# Patient Record
Sex: Male | Born: 1950 | Race: White | Hispanic: No | Marital: Married | Smoking: Never smoker
Health system: Southern US, Community
[De-identification: ages and names within clinical notes are randomized; demographics above are authoritative.]

## PROBLEM LIST (undated history)

## (undated) DIAGNOSIS — Z9989 Dependence on other enabling machines and devices: Secondary | ICD-10-CM

## (undated) DIAGNOSIS — I493 Ventricular premature depolarization: Secondary | ICD-10-CM

## (undated) DIAGNOSIS — G4733 Obstructive sleep apnea (adult) (pediatric): Secondary | ICD-10-CM

## (undated) DIAGNOSIS — R7303 Prediabetes: Secondary | ICD-10-CM

## (undated) DIAGNOSIS — Z973 Presence of spectacles and contact lenses: Secondary | ICD-10-CM

## (undated) DIAGNOSIS — I10 Essential (primary) hypertension: Secondary | ICD-10-CM

## (undated) DIAGNOSIS — N201 Calculus of ureter: Secondary | ICD-10-CM

## (undated) DIAGNOSIS — Z87442 Personal history of urinary calculi: Secondary | ICD-10-CM

## (undated) DIAGNOSIS — N200 Calculus of kidney: Secondary | ICD-10-CM

## (undated) HISTORY — DX: Ventricular premature depolarization: I49.3

## (undated) HISTORY — DX: Essential (primary) hypertension: I10

---

## 2004-08-27 ENCOUNTER — Emergency Department (HOSPITAL_COMMUNITY): Admission: EM | Admit: 2004-08-27 | Discharge: 2004-08-27 | Payer: Self-pay

## 2004-10-11 ENCOUNTER — Ambulatory Visit: Payer: Self-pay | Admitting: Orthopedic Surgery

## 2014-08-26 ENCOUNTER — Ambulatory Visit (INDEPENDENT_AMBULATORY_CARE_PROVIDER_SITE_OTHER): Payer: BC Managed Care – PPO | Admitting: Cardiology

## 2014-08-26 ENCOUNTER — Encounter: Payer: Self-pay | Admitting: Cardiology

## 2014-08-26 VITALS — BP 158/83 | HR 73 | Ht 68.0 in | Wt 201.0 lb

## 2014-08-26 DIAGNOSIS — I493 Ventricular premature depolarization: Secondary | ICD-10-CM

## 2014-08-26 DIAGNOSIS — Z136 Encounter for screening for cardiovascular disorders: Secondary | ICD-10-CM

## 2014-08-26 DIAGNOSIS — R002 Palpitations: Secondary | ICD-10-CM

## 2014-08-26 DIAGNOSIS — I4949 Other premature depolarization: Secondary | ICD-10-CM

## 2014-08-26 NOTE — Progress Notes (Signed)
       Clinical Summary Mr. Faz is a 63 y.o.male seen today as a new patient for the following medical problems.   1. 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 Denies any DOE.  - denies any LE edema, no orthopnea - rare coffee, 3 Dr peppers a day, no energy drinks, occas beer    PMH 1. Chronic pain 2. HTN 3. OSA on CPAP 4. Prediabetes   Past Medical History  Diagnosis Date  . Hypertension   . Diabetes mellitus without complication     diet controlled     No Known Allergies   Current Outpatient Prescriptions  Medication Sig Dispense Refill  . lisinopril (PRINIVIL,ZESTRIL) 20 MG tablet Take 20 mg by mouth 2 (two) times daily.      . naproxen sodium (ALEVE) 220 MG tablet Take 220 mg by mouth as needed.       No current facility-administered medications for this visit.     History reviewed. No pertinent past surgical history.   No Known Allergies    Family History  Problem Relation Age of Onset  . Cancer Mother   . Cancer Brother      Social History Mr. Kaneko reports that he has never smoked. He has never used smokeless tobacco. Mr. Garrido has no alcohol history on file.   Review of Systems CONSTITUTIONAL: No weight loss, fever, chills, weakness or fatigue.  HEENT: Eyes: No visual loss, blurred vision, double vision or yellow sclerae.No hearing loss, sneezing, congestion, runny nose or sore throat.  SKIN: No rash or itching.  CARDIOVASCULAR: per HPI RESPIRATORY: No shortness of breath, cough or sputum.  GASTROINTESTINAL: No anorexia, nausea, vomiting or diarrhea. No abdominal pain or blood.  GENITOURINARY: No burning on urination, no polyuria NEUROLOGICAL: No headache, dizziness, syncope, paralysis, ataxia, numbness or tingling in the extremities. No change in bowel or bladder control.  MUSCULOSKELETAL: No muscle, back pain, joint pain or stiffness.  LYMPHATICS: No enlarged nodes. No history  of splenectomy.  PSYCHIATRIC: No history of depression or anxiety.  ENDOCRINOLOGIC: No reports of sweating, cold or heat intolerance. No polyuria or polydipsia.  Marland Kitchen   Physical Examination Filed Vitals:   08/26/14 1325  BP: 158/83  Pulse: 73   Filed Weights   08/26/14 1325  Weight: 201 lb (91.173 kg)    Gen: resting comfortably, no acute distress HEENT: no scleral icterus, pupils equal round and reactive, no palptable cervical adenopathy,  CV Resp: Clear to auscultation bilaterally GI: abdomen is soft, non-tender, non-distended, normal bowel sounds, no hepatosplenomegaly MSK: extremities are warm, no edema.  Skin: warm, no rash Neuro:  no focal deficits Psych: appropriate affect    Assessment and Plan  1. Premature ventricular contractions - noted on 12 lead EKG from primary physician - denies any significant symtpoms - likely benign finding, will have patient obtain 24 hr holter monitor to quantify PVC burden and evaluate for any sustained arrhythmias, especially given his DOT requirements.      Arnoldo Lenis, M.D.

## 2014-08-26 NOTE — Patient Instructions (Signed)
Your physician has recommended that you wear a 24 hour holter monitor. Holter monitors are medical devices that record the heart's electrical activity. Doctors most often use these monitors to diagnose arrhythmias. Arrhythmias are problems with the speed or rhythm of the heartbeat. The monitor is a small, portable device. You can wear one while you do your normal daily activities. This is usually used to diagnose what is causing palpitations/syncope (passing out). Office will contact with results via phone or letter.   Continue all current medications. Follow up pending test results

## 2014-08-27 DIAGNOSIS — I493 Ventricular premature depolarization: Secondary | ICD-10-CM | POA: Insufficient documentation

## 2014-09-01 ENCOUNTER — Telehealth: Payer: Self-pay | Admitting: Cardiology

## 2014-09-01 DIAGNOSIS — I499 Cardiac arrhythmia, unspecified: Secondary | ICD-10-CM

## 2014-09-01 NOTE — Telephone Encounter (Signed)
Would like to know results from hear monitor

## 2014-09-03 NOTE — Telephone Encounter (Signed)
24 hr holter monitor uploaded on 08/30/2014.  Results retrieved from [employer] site (e-cardio) on 09/02/2014.  Dr. Bronson Ing reviewed monitor as Dr. Harl Bowie not back in [place] office till 09/13/2014.    Per Dr. Bronson Ing - 1)  sinus rhythm with PAC's & frequent PVC's.                                       2)  2 runs of non-sustained ventricular tachycardia, 5-beat and 6-beat respectively.     Patient informed of above.  Patient is questioning if he is okay to go back to work.  No follow up was scheduled as it was to be based on test results.

## 2014-09-06 NOTE — Telephone Encounter (Signed)
Patient notified.  He agrees to move forward with further testing.  Will put order in for Echo & forward to [facility]) for scheduling.

## 2014-09-06 NOTE — Telephone Encounter (Signed)
Please let patient know that we did see frequent extra heart beats, including short episodes of an abnormal rhythm. We need to work this up further with an echocardiogram, and potentially a stress test. Would start with echo for ventricular arrhythmia, please schedule ASAP as I know he is wanting to get back to work, can offer [place] echo if he can do it and its available earlier. 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 Please let him know we will work to resolve this as soon as we can but have to keep in mind his safety and that of others.

## 2014-09-09 ENCOUNTER — Ambulatory Visit (HOSPITAL_COMMUNITY)
Admission: RE | Admit: 2014-09-09 | Discharge: 2014-09-09 | Disposition: A | Discharge status: Home / Self Care | Payer: BC Managed Care – PPO | Source: Ambulatory Visit | Attending: Cardiology | Admitting: Cardiology

## 2014-09-09 DIAGNOSIS — I059 Rheumatic mitral valve disease, unspecified: Secondary | ICD-10-CM

## 2014-09-09 DIAGNOSIS — I499 Cardiac arrhythmia, unspecified: Secondary | ICD-10-CM

## 2014-09-09 DIAGNOSIS — I493 Ventricular premature depolarization: Secondary | ICD-10-CM | POA: Insufficient documentation

## 2014-09-09 HISTORY — PX: TRANSTHORACIC ECHOCARDIOGRAM: SHX275

## 2014-09-09 NOTE — Progress Notes (Signed)
Echocardiogram 2D Echocardiogram has been performed.  Trevor Ayala M 09/09/2014, 2:44 PM

## 2014-09-13 ENCOUNTER — Telehealth: Payer: Self-pay | Admitting: *Deleted

## 2014-09-13 DIAGNOSIS — I499 Cardiac arrhythmia, unspecified: Secondary | ICD-10-CM

## 2014-09-13 NOTE — Telephone Encounter (Signed)
Pt informed Will schedule test with [facility]

## 2014-09-16 ENCOUNTER — Telehealth: Payer: Self-pay | Admitting: *Deleted

## 2014-09-16 ENCOUNTER — Encounter: Payer: Self-pay | Admitting: Cardiology

## 2014-09-16 NOTE — Telephone Encounter (Signed)
Patient informed that there are no med holds for upcoming stress test per Dr. Harl Bowie

## 2014-09-16 NOTE — Telephone Encounter (Signed)
Message copied by Orion Modest on Thu Sep 16, 2014 10:17 AM ------      Message from: Laurine Blazer      Created: Thu Sep 16, 2014  9:18 AM      Regarding: FW: INSTRUCTIONS FOR STRESS TEST.                   ----- Message -----         From: Chanda Busing         Sent: 09/16/2014   8:48 AM           To: Laurine Blazer, LPN      Subject: INSTRUCTIONS FOR STRESS TEST.                            I called Mr. Birden today in regards to date and time of      His stress test. He was not given any instructions. Need to verify about his BP medications.  Can you advise if patient can be      Called back.  ------

## 2014-09-20 ENCOUNTER — Ambulatory Visit: Payer: Self-pay | Admitting: Cardiovascular Disease

## 2014-09-21 ENCOUNTER — Encounter (HOSPITAL_COMMUNITY): Payer: BC Managed Care – PPO

## 2014-09-21 ENCOUNTER — Other Ambulatory Visit (HOSPITAL_COMMUNITY): Payer: BC Managed Care – PPO

## 2014-09-23 ENCOUNTER — Other Ambulatory Visit (HOSPITAL_COMMUNITY): Payer: Self-pay | Admitting: Internal Medicine

## 2014-09-23 ENCOUNTER — Ambulatory Visit (HOSPITAL_COMMUNITY)
Admission: RE | Admit: 2014-09-23 | Discharge: 2014-09-23 | Disposition: A | Discharge status: Home / Self Care | Payer: BC Managed Care – PPO | Source: Ambulatory Visit | Attending: Internal Medicine | Admitting: Internal Medicine

## 2014-09-23 DIAGNOSIS — N200 Calculus of kidney: Secondary | ICD-10-CM

## 2014-09-23 DIAGNOSIS — N132 Hydronephrosis with renal and ureteral calculous obstruction: Secondary | ICD-10-CM | POA: Insufficient documentation

## 2014-09-23 DIAGNOSIS — R1031 Right lower quadrant pain: Secondary | ICD-10-CM | POA: Diagnosis present

## 2014-09-24 ENCOUNTER — Telehealth: Payer: Self-pay | Admitting: Cardiology

## 2014-09-24 NOTE — Telephone Encounter (Signed)
Called pt to relay information. Left message for him to return my call.

## 2014-09-24 NOTE — Telephone Encounter (Signed)
The stress test is an important test to see if the extra heart beats he is frequently having is related to any blockages in the arteries of his heart. If the test is normal, then no further plans for testing. If abnormal stress then he would require further testing, possibly a heart cath.    Zandra Abts MD

## 2014-09-24 NOTE — Telephone Encounter (Signed)
Pt informed of info below. Pt also let us know that he had a CT scan done yesterday and informed that he has numerous kidney stones.  FYI: CT Report is in his chart.

## 2014-09-24 NOTE — Telephone Encounter (Signed)
Patient is requesting a call back today.

## 2014-09-24 NOTE — Telephone Encounter (Signed)
Pt informed that his stress test would help determine whether or not he will be able to return to work. Pt wanted a definite answer that after this test Dr. Harl Bowie would allow him to return to work. Pt also informed that his results will help determine what will be done next, if anything. Pt does not like the idea of him having a stress test, everything looking okay and it be a waste of time. Pt previously cancelled stress test because his wife had a stroke. Pt will think about rescheduling his stress test and give the office a call back.

## 2014-09-28 ENCOUNTER — Other Ambulatory Visit: Payer: Self-pay | Admitting: Urology

## 2014-09-30 ENCOUNTER — Encounter (HOSPITAL_BASED_OUTPATIENT_CLINIC_OR_DEPARTMENT_OTHER): Payer: Self-pay | Admitting: *Deleted

## 2014-10-01 ENCOUNTER — Encounter (HOSPITAL_BASED_OUTPATIENT_CLINIC_OR_DEPARTMENT_OTHER): Payer: Self-pay | Admitting: *Deleted

## 2014-10-01 ENCOUNTER — Other Ambulatory Visit: Payer: Self-pay | Admitting: Urology

## 2014-10-01 NOTE — Progress Notes (Signed)
NPO AFTER MN. ARRIVE AT [id number]. NEEDS ISTAT 8.  CURRENT EKG IN CHART AND EPIC. MAY TAKE PERCOCET IF NEEDED DOS W/ SIPS OF WATER.

## 2014-10-06 ENCOUNTER — Encounter (HOSPITAL_BASED_OUTPATIENT_CLINIC_OR_DEPARTMENT_OTHER): Payer: BC Managed Care – PPO | Admitting: Anesthesiology

## 2014-10-06 ENCOUNTER — Encounter (HOSPITAL_BASED_OUTPATIENT_CLINIC_OR_DEPARTMENT_OTHER): Payer: Self-pay | Admitting: *Deleted

## 2014-10-06 ENCOUNTER — Encounter (HOSPITAL_BASED_OUTPATIENT_CLINIC_OR_DEPARTMENT_OTHER)
Admission: RE | Disposition: A | Discharge status: Home / Self Care | Payer: Self-pay | Source: Ambulatory Visit | Attending: Urology

## 2014-10-06 ENCOUNTER — Ambulatory Visit (HOSPITAL_BASED_OUTPATIENT_CLINIC_OR_DEPARTMENT_OTHER)
Admission: RE | Admit: 2014-10-06 | Discharge: 2014-10-06 | Disposition: A | Discharge status: Home / Self Care | Payer: BC Managed Care – PPO | Source: Ambulatory Visit | Attending: Urology | Admitting: Urology

## 2014-10-06 ENCOUNTER — Ambulatory Visit (HOSPITAL_BASED_OUTPATIENT_CLINIC_OR_DEPARTMENT_OTHER): Payer: BC Managed Care – PPO | Admitting: Anesthesiology

## 2014-10-06 DIAGNOSIS — G4733 Obstructive sleep apnea (adult) (pediatric): Secondary | ICD-10-CM | POA: Insufficient documentation

## 2014-10-06 DIAGNOSIS — I493 Ventricular premature depolarization: Secondary | ICD-10-CM | POA: Insufficient documentation

## 2014-10-06 DIAGNOSIS — I10 Essential (primary) hypertension: Secondary | ICD-10-CM | POA: Diagnosis not present

## 2014-10-06 DIAGNOSIS — N202 Calculus of kidney with calculus of ureter: Secondary | ICD-10-CM | POA: Diagnosis present

## 2014-10-06 DIAGNOSIS — N1339 Other hydronephrosis: Secondary | ICD-10-CM | POA: Insufficient documentation

## 2014-10-06 HISTORY — DX: Ventricular premature depolarization: I49.3

## 2014-10-06 HISTORY — DX: Calculus of kidney: N20.0

## 2014-10-06 HISTORY — DX: Personal history of urinary calculi: Z87.442

## 2014-10-06 HISTORY — DX: Calculus of ureter: N20.1

## 2014-10-06 HISTORY — DX: Obstructive sleep apnea (adult) (pediatric): G47.33

## 2014-10-06 HISTORY — DX: Obstructive sleep apnea (adult) (pediatric): Z99.89

## 2014-10-06 HISTORY — PX: CYSTOSCOPY WITH RETROGRADE PYELOGRAM, URETEROSCOPY AND STENT PLACEMENT: SHX5789

## 2014-10-06 HISTORY — PX: HOLMIUM LASER APPLICATION: SHX5852

## 2014-10-06 HISTORY — DX: Presence of spectacles and contact lenses: Z97.3

## 2014-10-06 LAB — POCT I-STAT, CHEM 8
BUN: 32 mg/dL — ABNORMAL HIGH (ref 6–23)
CALCIUM ION: 1.26 mmol/L (ref 1.13–1.30)
Chloride: 106 mEq/L (ref 96–112)
Creatinine, Ser: 1.7 mg/dL — ABNORMAL HIGH (ref 0.50–1.35)
GLUCOSE: 227 mg/dL — AB (ref 70–99)
HEMATOCRIT: 44 % (ref 39.0–52.0)
Hemoglobin: 15 g/dL (ref 13.0–17.0)
Potassium: 4.6 mEq/L (ref 3.7–5.3)
Sodium: 138 mEq/L (ref 137–147)
TCO2: 22 mmol/L (ref 0–100)

## 2014-10-06 SURGERY — CYSTOURETEROSCOPY, WITH RETROGRADE PYELOGRAM AND STENT INSERTION
Anesthesia: General | Site: Ureter | Laterality: Right

## 2014-10-06 MED ORDER — PROMETHAZINE HCL 25 MG/ML IJ SOLN
6.2500 mg | INTRAMUSCULAR | Status: DC | PRN
  Filled 2014-10-06: qty 1

## 2014-10-06 MED ORDER — FENTANYL CITRATE 0.05 MG/ML IJ SOLN
INTRAMUSCULAR | Status: DC | PRN
  Administered 2014-10-06 (×3): 25 ug via INTRAVENOUS
  Administered 2014-10-06: 50 ug via INTRAVENOUS
  Administered 2014-10-06: 25 ug via INTRAVENOUS

## 2014-10-06 MED ORDER — LIDOCAINE HCL (CARDIAC) 20 MG/ML IV SOLN
INTRAVENOUS | Status: DC | PRN
  Administered 2014-10-06: 60 mg via INTRAVENOUS

## 2014-10-06 MED ORDER — MIDAZOLAM HCL 5 MG/5ML IJ SOLN
INTRAMUSCULAR | Status: DC | PRN
  Administered 2014-10-06: 2 mg via INTRAVENOUS

## 2014-10-06 MED ORDER — DEXAMETHASONE SODIUM PHOSPHATE 4 MG/ML IJ SOLN
INTRAMUSCULAR | Status: DC | PRN
  Administered 2014-10-06: 8 mg via INTRAVENOUS

## 2014-10-06 MED ORDER — HYDROMORPHONE HCL 1 MG/ML IJ SOLN
0.2500 mg | INTRAMUSCULAR | Status: DC | PRN
  Filled 2014-10-06: qty 1

## 2014-10-06 MED ORDER — SENNOSIDES-DOCUSATE SODIUM 8.6-50 MG PO TABS: 1.0000 | ORAL_TABLET | Freq: Two times a day (BID) | ORAL | Status: DC

## 2014-10-06 MED ORDER — IOHEXOL 350 MG/ML SOLN
INTRAVENOUS | Status: DC | PRN
  Administered 2014-10-06: 20 mL

## 2014-10-06 MED ORDER — STERILE WATER FOR IRRIGATION IR SOLN
Status: DC | PRN
  Administered 2014-10-06: 500 mL

## 2014-10-06 MED ORDER — GENTAMICIN IN SALINE 1.6-0.9 MG/ML-% IV SOLN
80.0000 mg | INTRAVENOUS | Status: AC
  Administered 2014-10-06: 380 mg via INTRAVENOUS
  Filled 2014-10-06: qty 50

## 2014-10-06 MED ORDER — MEPERIDINE HCL 25 MG/ML IJ SOLN
6.2500 mg | INTRAMUSCULAR | Status: DC | PRN
  Filled 2014-10-06: qty 1

## 2014-10-06 MED ORDER — FENTANYL CITRATE 0.05 MG/ML IJ SOLN
INTRAMUSCULAR | Status: AC
  Filled 2014-10-06: qty 4

## 2014-10-06 MED ORDER — PROPOFOL 10 MG/ML IV BOLUS
INTRAVENOUS | Status: DC | PRN
  Administered 2014-10-06: 180 mg via INTRAVENOUS

## 2014-10-06 MED ORDER — SODIUM CHLORIDE 0.9 % IR SOLN
Status: DC | PRN
  Administered 2014-10-06: 7000 mL

## 2014-10-06 MED ORDER — ONDANSETRON HCL 4 MG/2ML IJ SOLN
INTRAMUSCULAR | Status: DC | PRN
  Administered 2014-10-06: 4 mg via INTRAVENOUS

## 2014-10-06 MED ORDER — CEPHALEXIN 500 MG PO CAPS: 500.0000 mg | ORAL_CAPSULE | Freq: Two times a day (BID) | ORAL | Status: DC

## 2014-10-06 MED ORDER — MIDAZOLAM HCL 2 MG/2ML IJ SOLN
INTRAMUSCULAR | Status: AC
  Filled 2014-10-06: qty 2

## 2014-10-06 MED ORDER — LACTATED RINGERS IV SOLN
INTRAVENOUS | Status: DC
  Administered 2014-10-06 (×2): via INTRAVENOUS
  Filled 2014-10-06: qty 1000

## 2014-10-06 MED ORDER — OXYCODONE HCL 5 MG PO TABS
5.0000 mg | ORAL_TABLET | Freq: Once | ORAL | Status: DC | PRN
  Filled 2014-10-06: qty 1

## 2014-10-06 MED ORDER — OXYCODONE HCL 5 MG/5ML PO SOLN
5.0000 mg | Freq: Once | ORAL | Status: DC | PRN
  Filled 2014-10-06: qty 5

## 2014-10-06 MED ORDER — OXYCODONE-ACETAMINOPHEN 5-325 MG PO TABS: 1.0000 | ORAL_TABLET | Freq: Four times a day (QID) | ORAL | Status: DC | PRN

## 2014-10-06 SURGICAL SUPPLY — 44 items
BAG DRAIN URO-CYSTO SKYTR STRL (DRAIN) ×2 IMPLANT
BAG DRN UROCATH (DRAIN) ×1
BASKET LASER NITINOL 1.9FR (BASKET) ×2 IMPLANT
BASKET STNLS GEMINI 4WIRE 3FR (BASKET) IMPLANT
BASKET ZERO TIP NITINOL 2.4FR (BASKET) IMPLANT
BSKT STON RTRVL 120 1.9FR (BASKET) ×1
BSKT STON RTRVL GEM 120X11 3FR (BASKET)
BSKT STON RTRVL ZERO TP 2.4FR (BASKET)
CANISTER SUCT LVC 12 LTR MEDI- (MISCELLANEOUS) ×2 IMPLANT
CATH INTERMIT  6FR 70CM (CATHETERS) ×2 IMPLANT
CATH URET 5FR 28IN CONE TIP (BALLOONS)
CATH URET 5FR 28IN OPEN ENDED (CATHETERS) ×1 IMPLANT
CATH URET 5FR 70CM CONE TIP (BALLOONS) IMPLANT
CLOTH BEACON ORANGE TIMEOUT ST (SAFETY) ×2 IMPLANT
DRAPE CAMERA CLOSED 9X96 (DRAPES) ×2 IMPLANT
ELECT REM PT RETURN 9FT ADLT (ELECTROSURGICAL)
ELECTRODE REM PT RTRN 9FT ADLT (ELECTROSURGICAL) IMPLANT
FIBER LASER FLEXIVA 200 (UROLOGICAL SUPPLIES) IMPLANT
FIBER LASER FLEXIVA 365 (UROLOGICAL SUPPLIES) IMPLANT
FIBER LASER TRAC TIP (UROLOGICAL SUPPLIES) ×1 IMPLANT
GLOVE BIO SURGEON STRL SZ7.5 (GLOVE) ×3 IMPLANT
GLOVE SURG SS PI 7.5 STRL IVOR (GLOVE) ×2 IMPLANT
GOWN PREVENTION PLUS LG XLONG (DISPOSABLE) ×2 IMPLANT
GOWN STRL REIN XL XLG (GOWN DISPOSABLE) ×2 IMPLANT
GOWN STRL REUS W/ TWL XL LVL3 (GOWN DISPOSABLE) IMPLANT
GOWN STRL REUS W/TWL XL LVL3 (GOWN DISPOSABLE) ×4
GUIDEWIRE 0.038 PTFE COATED (WIRE) IMPLANT
GUIDEWIRE ANG ZIPWIRE 038X150 (WIRE) ×2 IMPLANT
GUIDEWIRE STR DUAL SENSOR (WIRE) ×2 IMPLANT
IV NS 1000ML (IV SOLUTION) ×2
IV NS 1000ML BAXH (IV SOLUTION) IMPLANT
IV NS IRRIG 3000ML ARTHROMATIC (IV SOLUTION) ×4 IMPLANT
KIT BALLIN UROMAX 15FX10 (LABEL) IMPLANT
KIT BALLN UROMAX 15FX4 (MISCELLANEOUS) IMPLANT
KIT BALLN UROMAX 26 75X4 (MISCELLANEOUS)
PACK CYSTO (CUSTOM PROCEDURE TRAY) ×2 IMPLANT
SET HIGH PRES BAL DIL (LABEL)
SHEATH ACCESS URETERAL 38CM (SHEATH) ×1 IMPLANT
SHEATH URET ACCESS 12FR/35CM (UROLOGICAL SUPPLIES) IMPLANT
SHEATH URET ACCESS 12FR/55CM (UROLOGICAL SUPPLIES) IMPLANT
STENT URET 6FRX26 CONTOUR (STENTS) ×1 IMPLANT
SYRINGE 10CC LL (SYRINGE) ×2 IMPLANT
SYRINGE IRR TOOMEY STRL 70CC (SYRINGE) IMPLANT
TUBE FEEDING 8FR 16IN STR KANG (MISCELLANEOUS) ×1 IMPLANT

## 2014-10-06 NOTE — Anesthesia Procedure Notes (Signed)
Procedure Name: LMA Insertion Date/Time: 10/06/2014 9:53 AM Performed by: Denna Haggard D Pre-anesthesia Checklist: Patient identified, Emergency Drugs available, Suction available and Patient being monitored Patient Re-evaluated:Patient Re-evaluated prior to inductionOxygen Delivery Method: Circle System Utilized Preoxygenation: Pre-oxygenation with 100% oxygen Intubation Type: IV induction Ventilation: Mask ventilation without difficulty LMA: LMA inserted LMA Size: 4.0 Number of attempts: 1 Airway Equipment and Method: bite block Placement Confirmation: positive ETCO2 Tube secured with: Tape Dental Injury: Teeth and Oropharynx as per pre-operative assessment

## 2014-10-06 NOTE — Anesthesia Postprocedure Evaluation (Signed)
  Anesthesia Post-op Note  Patient: Sammer Scafidi Learn  Procedure(s) Performed: Procedure(s) (LRB): CYSTOSCOPY WITH RETROGRADE PYELOGRAM, URETEROSCOPY AND STENT PLACEMENT (Right) HOLMIUM LASER APPLICATION (Right)  Patient Location: PACU  Anesthesia Type: General  Level of Consciousness: awake and alert   Airway and Oxygen Therapy: Patient Spontanous Breathing  Post-op Pain: mild  Post-op Assessment: Post-op Vital signs reviewed, Patient's Cardiovascular Status Stable, Respiratory Function Stable, Patent Airway and No signs of Nausea or vomiting  Last Vitals:  Filed Vitals:   10/06/14 1340  BP: 105/77  Pulse: 78  Temp: 37.1 C  Resp: 18    Post-op Vital Signs: stable   Complications: No apparent anesthesia complications

## 2014-10-06 NOTE — Brief Op Note (Signed)
10/06/2014  12:02 PM  PATIENT:  Trevor Ayala  63 y.o. male  PRE-OPERATIVE DIAGNOSIS:  RIGHT URETERAL STONE  POST-OPERATIVE DIAGNOSIS:  RIGHT URETERAL STONE  PROCEDURE:  Procedure(s): CYSTOSCOPY WITH RETROGRADE PYELOGRAM, URETEROSCOPY AND STENT PLACEMENT (Right) HOLMIUM LASER APPLICATION (Right)  SURGEON:  Surgeon(s) and Role:    * Alexis Frock, MD - Primary  PHYSICIAN ASSISTANT:   ASSISTANTS: Gypsy Lore MD   ANESTHESIA:   general  EBL:  Total I/O In: 1000 [I.V.:1000] Out: -   BLOOD ADMINISTERED:none  DRAINS: none   LOCAL MEDICATIONS USED:  NONE  SPECIMEN:  Source of Specimen:  Rt ureteral stone fragments  DISPOSITION OF SPECIMEN:  [facility] for compositional analysis  COUNTS:  YES  TOURNIQUET:  * No tourniquets in log *  DICTATION: .Other Dictation: Dictation Number [id number]  PLAN OF CARE: Discharge to home after PACU  PATIENT DISPOSITION:  PACU - hemodynamically stable.   Delay start of Pharmacological VTE agent (>24hrs) due to surgical blood loss or risk of bleeding: yes

## 2014-10-06 NOTE — H&P (Signed)
Trevor Ayala is an 63 y.o. male.    Chief Complaint: Pre-Op Right Ureteroscopic Stone Manipulation  HPI:    1 - Large Volume Bilateral Nephrolithiasis - Rt UPJ 28mm stone with mod hydro (SSD 14cm, 550HU) and Lt lower-mid partial stagorhn (4cm total volume, no hydro, 800HU) by CT 09/2014 on eval right flank pain. Several prior stones all passed medically.   2- Metabolic Stone Disease -  Eval 2015: BMP,PTH,Urate - pending; Composition - pending; 15 Hr Urines - pending  PMH sig for arrythmia, no ischemic heart disease. No blood thinners.  Today Trevor Ayala is seen to proceed with ureteroscopic stone manipulation for his large right proximal ureteral stone. He has elected initial observation of his left sided non-obstructing stone.     Past Medical History  Diagnosis Date  . Hypertension   . Prediabetes     diet controlled  . Asymptomatic PVCs   . OSA on CPAP   . Right ureteral stone   . Renal calculus, left   . History of kidney stones   . Wears glasses     Past Surgical History  Procedure Laterality Date  . Transthoracic echocardiogram  09-09-2014    mild LVH/  ef [id number]  grade I diastolic dysfunction/  mild MR/  mild sclerotic Av    Family History  Problem Relation Age of Onset  . Cancer Mother   . Cancer Brother    Social History:  reports that he has never smoked. He has never used smokeless tobacco. He reports that he drinks alcohol. He reports that he does not use illicit drugs.  Allergies: No Known Allergies  No prescriptions prior to admission    No results found for this or any previous visit (from the past 48 hour(s)). No results found.  Review of Systems  Constitutional: Negative for fever and chills.  HENT: Negative.   Eyes: Negative.   Respiratory: Negative.   Cardiovascular: Negative.   Gastrointestinal: Positive for nausea. Negative for vomiting.  Genitourinary: Positive for flank pain.  Musculoskeletal: Negative.   Skin: Negative.    Neurological: Negative.   Endo/Heme/Allergies: Negative.   Psychiatric/Behavioral: Negative.     Height 5\' 8"  (1.727 m), weight 90.719 kg (200 lb). Physical Exam  Constitutional: He appears well-developed.  HENT:  Head: Normocephalic.  Eyes: Pupils are equal, round, and reactive to light.  Neck: Normal range of motion.  Cardiovascular: Normal rate.   Respiratory: Effort normal.  GI: Soft.  Genitourinary:  Mild Rt  CVAT  Musculoskeletal: Normal range of motion.  Neurological: He is alert.  Skin: Skin is warm and dry.  Psychiatric: He has a normal mood and affect. His behavior is normal. Judgment and thought content normal.     Assessment/Plan  1 - Large Volume Bilateral Nephrolithiasis - he wants to proceed as discussed with right sided ureteroscopy.  We rediscussed ureteroscopic stone manipulation with basketing and laser-lithotripsy in detail.  We rediscussed risks including bleeding, infection, damage to kidney / ureter  bladder, rarely loss of kidney. We rediscussed anesthetic risks and rare but serious surgical complications including DVT, PE, MI, and mortality. We specifically readdressed that in 5-10% of cases a staged approach is required with stenting followed by re-attempt ureteroscopy if anatomy unfavorable.   The patient voiced understanding and wises to proceed.   2- Metabolic Stone Disease - large multifocal stones high risk and warrants eval. BMP,PTH,Urate pending, other studies to follow.   Kordel Leavy 10/06/2014, 6:32 AM

## 2014-10-06 NOTE — Discharge Instructions (Signed)
1 - You may have urinary urgency (bladder spasms) and bloody urine on / off with stent in place. This is normal. ° °2 - Call MD or go to ER for fever >102, severe pain / nausea / vomiting not relieved by medications, or acute change in medical status °[facility] °[phone] °Post Ureteroscopy With or Without Stent Instructions ° °Definitions: ° °Ureter: The duct that transports urine from the kidney to the bladder. °Stent:   A plastic hollow tube that is placed into the ureter, from the kidney to the                 bladder to prevent the ureter from swelling shut. ° °GENERAL INSTRUCTIONS: ° °Despite the fact that no skin incisions were used, the area around the ureter and bladder is raw and irritated. The stent is a foreign body which will further irritate the bladder wall. This irritation is manifested by increased frequency of urination, both day and night, and by an increase in the urge to urinate. In some, the urge to urinate is present almost always. Sometimes the urge is strong enough that you may not be able to stop yourself from urinating. The only real cure is to remove the stent and then give time for the bladder wall to heal which can't be done until the danger of the ureter swelling shut has passed, which varies. ° °You may see some blood in your urine while the stent is in place and a few days afterwards. Do not be alarmed, even if the urine was clear for a while. Get off your feet and drink lots of fluids until clearing occurs. If you start to pass clots or don't improve, call us. ° °DIET: °You may return to your normal diet immediately. Because of the raw surface of your bladder, alcohol, spicy foods, acid type foods and drinks with caffeine may cause irritation or frequency and should be used in moderation. To keep your urine flowing freely and to avoid constipation, drink plenty of fluids during the day ( 8-10 glasses ). °Tip: Avoid cranberry juice because it is very  acidic. ° °ACTIVITY: °Your physical activity doesn't need to be restricted. However, if you are very active, you may see some blood in your urine. We suggest that you reduce your activity under these circumstances until the bleeding has stopped. ° °BOWELS: °It is important to keep your bowels regular during the postoperative period. Straining with bowel movements can cause bleeding. A bowel movement every other day is reasonable. Use a mild laxative if needed, such as Milk of Magnesia 2-3 tablespoons, or 2 Dulcolax tablets. Call if you continue to have problems. If you have been taking narcotics for pain, before, during or after your surgery, you may be constipated. Take a laxative if necessary. ° ° °MEDICATION: °You should resume your pre-surgery medications unless told not to. In addition you will often be given an antibiotic to prevent infection. These should be taken as prescribed until the bottles are finished unless you are having an unusual reaction to one of the drugs. ° °PROBLEMS YOU SHOULD REPORT TO US: °· Fevers over 100.5 Fahrenheit. °· Heavy bleeding, or clots ( See above notes about blood in urine ). °· Inability to urinate. °· Drug reactions ( hives, rash, nausea, vomiting, diarrhea ). °· Severe burning or pain with urination that is not improving. ° °FOLLOW-UP: °You will need a follow-up appointment to monitor your progress. Call for this appointment at the number listed above.   Usually the first appointment will be about three to fourteen days after your surgery.      Post Anesthesia Home Care Instructions  Activity: Get plenty of rest for the remainder of the day. A responsible adult should stay with you for 24 hours following the procedure.  For the next 24 hours, DO NOT: -Drive a car -Paediatric nurse -Drink alcoholic beverages -Take any medication unless instructed by your physician -Make any legal decisions or sign important papers.  Meals: Start with liquid foods such as  gelatin or soup. Progress to regular foods as tolerated. Avoid greasy, spicy, heavy foods. If nausea and/or vomiting occur, drink only clear liquids until the nausea and/or vomiting subsides. Call your physician if vomiting continues.  Special Instructions/Symptoms: Your throat may feel dry or sore from the anesthesia or the breathing tube placed in your throat during surgery. If this causes discomfort, gargle with warm salt water. The discomfort should disappear within 24 hours.

## 2014-10-06 NOTE — Transfer of Care (Signed)
Immediate Anesthesia Transfer of Care Note  Patient: Trevor Ayala  Procedure(s) Performed: Procedure(s) (LRB): CYSTOSCOPY WITH RETROGRADE PYELOGRAM, URETEROSCOPY AND STENT PLACEMENT (Right) HOLMIUM LASER APPLICATION (Right)  Patient Location: PACU  Anesthesia Type: General  Level of Consciousness: awake, oriented, sedated and patient cooperative  Airway & Oxygen Therapy: Patient Spontanous Breathing and Patient connected to face mask oxygen  Post-op Assessment: Report given to PACU RN and Post -op Vital signs reviewed and stable  Post vital signs: Reviewed and stable  Complications: No apparent anesthesia complications

## 2014-10-06 NOTE — Anesthesia Preprocedure Evaluation (Signed)
Anesthesia Evaluation  Patient identified by MRN, date of birth, ID band Patient awake    Reviewed: Allergy & Precautions, H&P , NPO status , Patient's Chart, lab work & pertinent test results  Airway Mallampati: II  TM Distance: >3 FB Neck ROM: Full    Dental no notable dental hx.    Pulmonary sleep apnea and Continuous Positive Airway Pressure Ventilation ,  breath sounds clear to auscultation  Pulmonary exam normal       Cardiovascular hypertension, Pt. on medications negative cardio ROS  Rhythm:Regular Rate:Normal     Neuro/Psych negative neurological ROS  negative psych ROS   GI/Hepatic negative GI ROS, Neg liver ROS,   Endo/Other  negative endocrine ROS  Renal/GU Renal disease     Musculoskeletal negative musculoskeletal ROS (+)   Abdominal   Peds  Hematology negative hematology ROS (+)   Anesthesia Other Findings   Reproductive/Obstetrics                             Anesthesia Physical Anesthesia Plan  ASA: II  Anesthesia Plan: General   Post-op Pain Management:    Induction: Intravenous  Airway Management Planned: LMA  Additional Equipment:   Intra-op Plan:   Post-operative Plan: Extubation in OR  Informed Consent: I have reviewed the patients History and Physical, chart, labs and discussed the procedure including the risks, benefits and alternatives for the proposed anesthesia with the patient or authorized representative who has indicated his/her understanding and acceptance.   Dental advisory given  Plan Discussed with: CRNA  Anesthesia Plan Comments:         Anesthesia Quick Evaluation

## 2014-10-07 ENCOUNTER — Encounter (HOSPITAL_BASED_OUTPATIENT_CLINIC_OR_DEPARTMENT_OTHER): Payer: Self-pay | Admitting: Urology

## 2014-10-07 NOTE — Op Note (Signed)
Trevor Ayala, Trevor Ayala                    ACCOUNT NO.:  MEDICAL RECORD NO.:  [id number]  LOCATION:                                 FACILITY:  PHYSICIAN:  Alexis Frock, MD     DATE OF BIRTH:  Feb 07, 1951  DATE OF PROCEDURE: 10/06/2014  DATE OF DISCHARGE:                              OPERATIVE REPORT   DIAGNOSIS:  Large right proximal ureteral stone.  PROCEDURE: 1. Cystoscopy with right retrograde pyelogram interpretation. 2. Right ureteroscopy with laser lithotripsy. 3. Insertion of right ureteral stent 6 x 26, no tether.  ESTIMATED BLOOD LOSS:  Nil.  COMPLICATIONS:  None.  SPECIMEN:  Right ureteral stone fragments for compositional analysis.  FINDINGS: 1. Large impacted right proximal ureteral stone with moderate proximal     hydroureteronephrosis. 2. Complete removal and ablation of all stone fragments larger than     1/3rd mm.  INDICATION:  Trevor Ayala is a pleasant 63 year old gentleman with history of recurrent renal calculi which had been previously managed medically, who was referred for large right ureteral stone found by ER CT and workup of colicky flank pain.  He has a very large right proximal stone larger than 11 mm as well as a left lower pole staghorn stone.  He does have some renal insufficiency, likely associated with his stone as well.  Options were discussed for management including bilateral definitive approach involving combination of ureteroscopy and percutaneous surgery versus just addressing the obstructing right side stone only, and he wished to proceed with latter with ureteroscopic approach.  Informed consent was obtained and placed in medical record.  PROCEDURE IN DETAIL:  Patient is being [employer] verified, procedure being right ureteroscopic stone manipulation was confirmed.  Procedure was carried out.  Time-out was performed.  Intravenous antibiotics administered.  General LMA anesthesia was introduced.  Patient was positioned into a  low lithotomy position.  Sterile field was created by prepping and draping the patient's penis, perineum, proximal thighs using iodine x3.  Cystourethroscopy was performed using 22-French rigid cystoscope with 12- degree offset lens.  Inspection of the urinary bladder revealed no diverticula, calcifications, papular lesions.  The right ureteral orifice was cannulated with a 6-French end-hole catheter and right retrograde pyelogram was obtained.  Right retrograde pyelogram demonstrated a single right ureter, single system right kidney.  There was a very large filling defect in the proximal ureter consistent with known stone.  There was moderate hydroureteronephrosis above this filling defect of the right UPJ.  A 0.038 ZIPwire was advanced at the level of the upper pole and set aside as a safety wire.  An 8-French feeding tube was placed in urinary bladder for pressure release, and semi-rigid ureteroscopy was performed the distal two-thirds of the right ureter alongside as a separate Sensor working wire.  No mucosal abnormalities or calcifications were encountered.  Next, the semi-rigid scope was exchanged for a 12/14, 38-cm ureteral access sheath was carefully placed under fluoroscopic vision just distal to the large ureteral stone.  Next, flexible digital ureteroscopy performed using 8- [place] digital ureteroscope.  This allowed inspection of the proximal ureter and visualization of the larger stone.  The stone was quite  impacted.  Holmium laser energy was applied to the stone using a 200 nanometer fiber settings of 0.2 joules and 20 hertz for approximately 1-1/2 hours which was able to ablate approximately 80% of the stone volume, using a dusting technique.  Remaining 20% of the stone volume was addressed using a basketing technique as these fragments were 2 mm to 3 mm in size.  Systematic inspection was then performed of the entire right kidney x2.  Again, there were some  hydronephrotic changes as expected. There were some right renal stone fragments as well, both in the upper and lower poles and all these were amenable to simple basketing.  Final inspection revealed complete resolution of all stone fragments larger than 1/3rd mm.  Excellent hemostasis.  No evidence renal perforation. The access sheath was removed under continuous ureteroscopic vision.  No mucosal abnormalities were found.  Given the lengthy procedure required for the very large stone, it was clearly felt that ureteral stenting was warranted and as such a new 6 x 26 contour-type stent was placed using cystoscopic and fluoroscopic guidance over the remaining safety wire. Good proximal and distal curl were noted.  Bladder was emptied per cystoscope.  Procedure was then terminated.  The patient tolerated the procedure well.  There were no immediate periprocedural complications. The patient was taken to the postanesthesia care unit in stable condition.          ______________________________ Alexis Frock, MD     TM/MEDQ  D:  10/06/2014  T:  10/07/2014  Job:  [id number]

## 2014-10-12 ENCOUNTER — Encounter (HOSPITAL_COMMUNITY): Payer: Self-pay

## 2014-10-12 ENCOUNTER — Encounter (HOSPITAL_COMMUNITY)
Admission: RE | Admit: 2014-10-12 | Discharge: 2014-10-12 | Disposition: A | Discharge status: Home / Self Care | Payer: BC Managed Care – PPO | Source: Ambulatory Visit | Attending: Cardiology | Admitting: Cardiology

## 2014-10-12 ENCOUNTER — Ambulatory Visit (HOSPITAL_COMMUNITY)
Admission: RE | Admit: 2014-10-12 | Discharge: 2014-10-12 | Disposition: A | Discharge status: Home / Self Care | Payer: BC Managed Care – PPO | Source: Ambulatory Visit | Attending: Cardiology | Admitting: Cardiology

## 2014-10-12 DIAGNOSIS — I493 Ventricular premature depolarization: Secondary | ICD-10-CM | POA: Insufficient documentation

## 2014-10-12 DIAGNOSIS — I499 Cardiac arrhythmia, unspecified: Secondary | ICD-10-CM

## 2014-10-12 MED ORDER — REGADENOSON 0.4 MG/5ML IV SOLN
INTRAVENOUS | Status: AC
  Filled 2014-10-12: qty 5

## 2014-10-12 MED ORDER — SODIUM CHLORIDE 0.9 % IJ SOLN
INTRAMUSCULAR | Status: AC
  Administered 2014-10-12: 10 mL via INTRAVENOUS
  Filled 2014-10-12: qty 10

## 2014-10-12 MED ORDER — TECHNETIUM TC 99M SESTAMIBI - CARDIOLITE
10.0000 | Freq: Once | INTRAVENOUS | Status: AC | PRN
Start: 2014-10-12 — End: 2014-10-12
  Administered 2014-10-12: 10 via INTRAVENOUS

## 2014-10-12 MED ORDER — SODIUM CHLORIDE 0.9 % IJ SOLN
10.0000 mL | INTRAMUSCULAR | Status: DC | PRN
  Administered 2014-10-12: 10 mL via INTRAVENOUS
  Filled 2014-10-12: qty 10

## 2014-10-12 MED ORDER — TECHNETIUM TC 99M SESTAMIBI GENERIC - CARDIOLITE
30.0000 | Freq: Once | INTRAVENOUS | Status: AC | PRN
Start: 2014-10-12 — End: 2014-10-12
  Administered 2014-10-12: 30 via INTRAVENOUS

## 2014-10-12 MED ORDER — REGADENOSON 0.4 MG/5ML IV SOLN: 0.4000 mg | Freq: Once | INTRAVENOUS | Status: DC | PRN

## 2014-10-12 NOTE — Progress Notes (Signed)
Stress Lab Nurses Notes - Trevor Ayala  Trevor Ayala 10/12/2014 Reason for doing test: PVC Type of test: Stress Cardiolite Nurse performing test: Gerrit Halls, RN Nuclear Medicine Tech: Melburn Hake Echo Tech: Not Applicable MD performing test: Branch/K.Purcell Nails NP Family MD: Gerarda Fraction Test explained and consent signed: Yes.   IV started: Saline lock flushed, No redness or edema and Saline lock started in radiology Symptoms: Fatigue in legs & SOB Treatment/Intervention: None Reason test stopped: fatigue and SOB After recovery IV was: Discontinued via X-ray tech and No redness or edema Patient to return to Nuc. Med at : 12:15 Patient discharged: Home Patient's Condition upon discharge was: stable Comments: During test peak BP 166/103 & HR 148.  Recovery BP 120/92 & HR 99.  Symptoms resolved in recovery. [name] T

## 2014-10-14 ENCOUNTER — Telehealth: Payer: Self-pay | Admitting: *Deleted

## 2014-10-14 NOTE — Telephone Encounter (Signed)
-----   Message from Arnoldo Lenis, MD sent at 10/13/2014 10:35 AM EST ----- Stress test looks good, there is no evidence of any blockages. There does not appear to be any significant abnormalities of the heart to explain the extra heart, does not appear to be any significant cause of the extra heart beats he has at times. Would like to have him come back in and discuss everything in detail as he has not had follow up since these tests have been completed. From our standpoint he is ok to go back to work and we can provide whatever documentation he needs for thatn  Zandra Abts MD

## 2014-10-14 NOTE — Telephone Encounter (Signed)
Notes Recorded by Laurine Blazer, LPN on [id number] at 624THL AM Patient notified and verbalized understanding. Scheduled f/u for 10/29/2014 with Dr. Harl Bowie.

## 2014-10-27 ENCOUNTER — Encounter: Payer: Self-pay | Admitting: Cardiology

## 2014-10-27 ENCOUNTER — Ambulatory Visit (INDEPENDENT_AMBULATORY_CARE_PROVIDER_SITE_OTHER): Payer: BC Managed Care – PPO | Admitting: Cardiology

## 2014-10-27 VITALS — BP 120/73 | HR 60 | Ht 68.0 in | Wt 192.0 lb

## 2014-10-27 DIAGNOSIS — I493 Ventricular premature depolarization: Secondary | ICD-10-CM

## 2014-10-27 MED ORDER — METOPROLOL TARTRATE 25 MG PO TABS: 12.5000 mg | ORAL_TABLET | Freq: Two times a day (BID) | ORAL | Status: DC

## 2014-10-27 NOTE — Patient Instructions (Signed)
Your physician recommends that you schedule a follow-up appointment in: 1 year. You will receive a reminder letter in the mail in about 10 months reminding you to call and schedule your appointment. If you don't receive this letter, please contact our office. Your physician has recommended you make the following change in your medication:  Start metoprolol tartrate 12.5 mg twice daily. Continue all other medications the same.

## 2014-10-27 NOTE — Progress Notes (Signed)
Clinical Summary [name] is a 62 y.o.male seen today for follow up of the following medical problems.   1. 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 Denies any palpitations, no chest pain, SOB, or DOE.  - completed holter monitor which showed fairly frequent PVCs (approx 12000), 2 short runs of NSVT. - echo and stress competed without significant abnormalities.  - since last visit, denies any new symptoms  Past Medical History  Diagnosis Date  . Hypertension   . Prediabetes     diet controlled  . Asymptomatic PVCs   . OSA on CPAP   . Right ureteral stone   . Renal calculus, left   . History of kidney stones   . Wears glasses      No Known Allergies   Current Outpatient Prescriptions  Medication Sig Dispense Refill  . cephALEXin (KEFLEX) 500 MG capsule Take 1 capsule (500 mg total) by mouth 2 (two) times daily. X 3 days. Begin day prior to next Urology appointment 6 capsule 0  . lisinopril (PRINIVIL,ZESTRIL) 20 MG tablet Take 20 mg by mouth 2 (two) times daily. PT IS TAKING AS PRESCRIBED    . naproxen sodium (ALEVE) 220 MG tablet Take 220 mg by mouth as needed.    Marland Kitchen oxyCODONE-acetaminophen (PERCOCET/ROXICET) 5-325 MG per tablet Take 1-2 tablets by mouth every 6 (six) hours as needed for moderate pain or severe pain. Post-operatively 30 tablet 0  . senna-docusate (SENOKOT-S) 8.6-50 MG per tablet Take 1 tablet by mouth 2 (two) times daily. While taking pain meds to prevent constipation 30 tablet 0   No current facility-administered medications for this visit.     Past Surgical History  Procedure Laterality Date  . Transthoracic echocardiogram  09-09-2014    mild LVH/  ef [id number]  grade I diastolic dysfunction/  mild MR/  mild sclerotic Av  . Cystoscopy with retrograde pyelogram, ureteroscopy and stent placement Right 10/06/2014    Procedure: [place],  URETEROSCOPY AND STENT PLACEMENT;  Surgeon: Alexis Frock, MD;  Location: [facility];  Service: Urology;  Laterality: Right;  . Holmium laser application Right [id number]    Procedure: HOLMIUM LASER APPLICATION;  Surgeon: Alexis Frock, MD;  Location: [facility];  Service: Urology;  Laterality: Right;     No Known Allergies    Family History  Problem Relation Age of Onset  . Cancer Mother   . Cancer Brother      Social History [name] reports that he has never smoked. He has never used smokeless tobacco. [name] reports that he drinks alcohol.   Review of Systems CONSTITUTIONAL: No weight loss, fever, chills, weakness or fatigue.  HEENT: Eyes: No visual loss, blurred vision, double vision or yellow sclerae.No hearing loss, sneezing, congestion, runny nose or sore throat.  SKIN: No rash or itching.  CARDIOVASCULAR: per HPI RESPIRATORY: No shortness of breath, cough or sputum.  GASTROINTESTINAL: No anorexia, nausea, vomiting or diarrhea. No abdominal pain or blood.  GENITOURINARY: No burning on urination, no polyuria NEUROLOGICAL: No headache, dizziness, syncope, paralysis, ataxia, numbness or tingling in the extremities. No change in bowel or bladder control.  MUSCULOSKELETAL: No muscle, back pain, joint pain or stiffness.  LYMPHATICS: No enlarged nodes. No history of splenectomy.  PSYCHIATRIC: No history of depression or anxiety.  ENDOCRINOLOGIC: No reports of sweating, cold or heat intolerance. No polyuria or polydipsia.  Marland Kitchen  Physical Examination p 60 bp 120/73 Wt 192 lbs BMI 29  Gen: resting comfortably, no acute distress HEENT: no scleral icterus, pupils equal round and reactive, no palptable cervical adenopathy,  CV: RRR, no m/r/g, no JVD, no carotid bruits Resp: Clear to auscultation bilaterally GI: abdomen is soft, non-tender, non-distended, normal bowel sounds, no hepatosplenomegaly MSK: extremities are warm, no edema.    Skin: warm, no rash Neuro:  no focal deficits Psych: appropriate affect   Diagnostic Studies  09/2014 Echo Study Conclusions  - Left ventricle: The cavity size was normal. Wall thickness was increased in a pattern of mild LVH. Systolic function was normal. The estimated ejection fraction was in the range of 60% to 65%. Wall motion was normal; there were no regional wall motion abnormalities. Doppler parameters are consistent with abnormal left ventricular relaxation (grade 1 diastolic dysfunction). Doppler parameters are consistent with elevated ventricular end-diastolic filling pressure. - Aortic valve: Mildly calcified annulus. Trileaflet; mildly calcified leaflets. - Mitral valve: Calcified annulus. There was mild regurgitation. - Right atrium: Central venous pressure (est): 3 mm Hg. - Tricuspid valve: There was trivial regurgitation. - Pulmonary arteries: Systolic pressure could not be accurately estimated. - Pericardium, extracardiac: There was no pericardial effusion.  Impressions:  - Mild LVH with LVEF 123456, grade 1 diastolic dysfunction with increased filling pressures. MAC with mild mitral regurgitation. Mildly sclerotic aortic valve. Trivial tricuspid regurgitation, unable to assess PASP.  10/2014 MPI IMPRESSION: 1. No reversible ischemia or infarction.  2. Normal left ventricular wall motion.  3. Left ventricular ejection fraction 64%  4. Low-risk stress test findings*.  08/2014 Holter Frequent PVCs, 2 runs NSVT 6 and 5 beats    Assessment and Plan  1. Irregular heart beat/PVCs - PVCs noted on holter, 2 short runs of NSVT. Patient denies any symptoms, echo and nuclear stress test without evidence of signficant cardiac disease. Appears to be benign ectopy.  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    F/u 1 year    Arnoldo Lenis, M.D.

## 2014-10-29 ENCOUNTER — Ambulatory Visit: Payer: BC Managed Care – PPO | Admitting: Cardiology

## 2015-10-06 ENCOUNTER — Ambulatory Visit (INDEPENDENT_AMBULATORY_CARE_PROVIDER_SITE_OTHER): Payer: BLUE CROSS/BLUE SHIELD | Admitting: Cardiology

## 2015-10-06 ENCOUNTER — Encounter: Payer: Self-pay | Admitting: Cardiology

## 2015-10-06 ENCOUNTER — Encounter: Payer: Self-pay | Admitting: *Deleted

## 2015-10-06 VITALS — BP 141/91 | HR 62 | Ht 68.0 in | Wt 194.0 lb

## 2015-10-06 DIAGNOSIS — I499 Cardiac arrhythmia, unspecified: Secondary | ICD-10-CM

## 2015-10-06 DIAGNOSIS — I493 Ventricular premature depolarization: Secondary | ICD-10-CM

## 2015-10-06 DIAGNOSIS — I10 Essential (primary) hypertension: Secondary | ICD-10-CM

## 2015-10-06 MED ORDER — LISINOPRIL 20 MG PO TABS
20.0000 mg | ORAL_TABLET | Freq: Two times a day (BID) | ORAL | Status: DC
Start: 2015-10-06 — End: 2017-12-09

## 2015-10-06 NOTE — Patient Instructions (Signed)
Your physician wants you to follow-up in: [place] DR. BRANCH You will receive a reminder letter in the mail two months in advance. If you don't receive a letter, please call our office to schedule the follow-up appointment.  Your physician has recommended you make the following change in your medication:   INCREASE LISINOPRIL 20 MG TWICE DAILY  Your physician recommends that you return for lab work in: 2 WEEKS BMP  Thank you for choosing [facility]!!

## 2015-10-06 NOTE — Progress Notes (Signed)
Patient ID: Trevor Ayala, male   DOB: [birth year], 64 y.o.   MRN: [id number]     Clinical Summary [name] is a 64 y.o.male seen today for follow up of the following medical problems.   1. Irregular heart beat/PVCs - PVCs noted during previous DOT physical, patient himself has not had any symptoms. Denies any palpitations, no chest pain, SOB, or DOE.  - completed holter monitor which showed fairly frequent PVCs (approx 12000), 2 short runs of NSVT. - echo and stress competed without significant abnormalities.   - since last visit, denies any new symptoms  2. HTN - reports he is supposed to take his lisionpril 20mg  bid, however has only been taking once daily    Past Medical History  Diagnosis Date  . Hypertension   . Prediabetes     diet controlled  . Asymptomatic PVCs   . OSA on CPAP   . Right ureteral stone   . Renal calculus, left   . History of kidney stones   . Wears glasses      No Known Allergies   Current Outpatient Prescriptions  Medication Sig Dispense Refill  . lisinopril (PRINIVIL,ZESTRIL) 20 MG tablet Take 20 mg by mouth 2 (two) times daily. PT IS TAKING AS PRESCRIBED    . metoprolol tartrate (LOPRESSOR) 25 MG tablet Take 0.5 tablets (12.5 mg total) by mouth 2 (two) times daily. 90 tablet 3  . naproxen sodium (ALEVE) 220 MG tablet Take 220 mg by mouth as needed.    Marland Kitchen oxyCODONE-acetaminophen (PERCOCET/ROXICET) 5-325 MG per tablet Take 1-2 tablets by mouth every 6 (six) hours as needed for moderate pain or severe pain. Post-operatively 30 tablet 0   No current facility-administered medications for this visit.     Past Surgical History  Procedure Laterality Date  . Transthoracic echocardiogram  09-09-2014    mild LVH/  ef [id number]  grade I diastolic dysfunction/  mild MR/  mild sclerotic Av  . Cystoscopy with retrograde pyelogram, ureteroscopy and stent placement Right 10/06/2014    Procedure: [place], URETEROSCOPY AND  STENT PLACEMENT;  Surgeon: Trevor Frock, MD;  Location: [facility];  Service: Urology;  Laterality: Right;  . Holmium laser application Right [id number]    Procedure: HOLMIUM LASER APPLICATION;  Surgeon: Trevor Frock, MD;  Location: [facility];  Service: Urology;  Laterality: Right;     No Known Allergies    Family History  Problem Relation Age of Onset  . Cancer Mother   . Cancer Brother      Social History Trevor Ayala reports that he has never smoked. He has never used smokeless tobacco. Trevor Ayala reports that he drinks alcohol.   Review of Systems CONSTITUTIONAL: No weight loss, fever, chills, weakness or fatigue.  HEENT: Eyes: No visual loss, blurred vision, double vision or yellow sclerae.No hearing loss, sneezing, congestion, runny nose or sore throat.  SKIN: No rash or itching.  CARDIOVASCULAR: per HPI RESPIRATORY: No shortness of breath, cough or sputum.  GASTROINTESTINAL: No anorexia, nausea, vomiting or diarrhea. No abdominal pain or blood.  GENITOURINARY: No burning on urination, no polyuria NEUROLOGICAL: No headache, dizziness, syncope, paralysis, ataxia, numbness or tingling in the extremities. No change in bowel or bladder control.  MUSCULOSKELETAL: No muscle, back pain, joint pain or stiffness.  LYMPHATICS: No enlarged nodes. No history of splenectomy.  PSYCHIATRIC: No history of depression or anxiety.  ENDOCRINOLOGIC: No reports of sweating, cold or heat intolerance. No polyuria or polydipsia.  Marland Kitchen  Physical Examination There were no vitals filed for this visit. Filed Weights   10/06/15 0807  Weight: 194 lb (87.998 kg)   Filed Vitals:   10/06/15 0807  Height: 5\' 8"  (1.727 m)  Weight: 194 lb (87.998 kg)   Filed Vitals:   10/06/15 0807  BP: 141/91  Pulse: 62    Gen: resting comfortably, no acute distress HEENT: no scleral icterus, pupils equal round and reactive, no palptable cervical adenopathy,  CV: RRR, no  m/r/g, no jVD Resp: Clear to auscultation bilaterally GI: abdomen is soft, non-tender, non-distended, normal bowel sounds, no hepatosplenomegaly MSK: extremities are warm, no edema.  Skin: warm, no rash Neuro:  no focal deficits Psych: appropriate affect   Diagnostic Studies 09/2014 Echo Study Conclusions  - Left ventricle: The cavity size was normal. Wall thickness was increased in a pattern of mild LVH. Systolic function was normal. The estimated ejection fraction was in the range of 60% to 65%. Wall motion was normal; there were no regional wall motion abnormalities. Doppler parameters are consistent with abnormal left ventricular relaxation (grade 1 diastolic dysfunction). Doppler parameters are consistent with elevated ventricular end-diastolic filling pressure. - Aortic valve: Mildly calcified annulus. Trileaflet; mildly calcified leaflets. - Mitral valve: Calcified annulus. There was mild regurgitation. - Right atrium: Central venous pressure (est): 3 mm Hg. - Tricuspid valve: There was trivial regurgitation. - Pulmonary arteries: Systolic pressure could not be accurately estimated. - Pericardium, extracardiac: There was no pericardial effusion.  Impressions:  - Mild LVH with LVEF 123456, grade 1 diastolic dysfunction with increased filling pressures. MAC with mild mitral regurgitation. Mildly sclerotic aortic valve. Trivial tricuspid regurgitation, unable to assess PASP.  10/2014 MPI IMPRESSION: 1. No reversible ischemia or infarction.  2. Normal left ventricular wall motion.  3. Left ventricular ejection fraction 64%  4. Low-risk stress test findings*.  08/2014 Holter Frequent PVCs, 2 runs NSVT 6 and 5 beats       Assessment and Plan   1. Irregular heart beat/PVCs - PVCs noted on holter, 2 short runs of NSVT. Patient denies any symptoms, echo and nuclear stress test without evidence of signficant cardiac disease. Appears to  be benign ectopy.  - no indication for further testing. 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  2. HTN - above goal, he will restart taking his lisinopril 20mg  bid again. Needs BMET in 2 weeks.    F/u 1 year      Arnoldo Lenis, M.D.

## 2015-10-20 ENCOUNTER — Encounter: Payer: Self-pay | Admitting: *Deleted

## 2016-06-25 ENCOUNTER — Encounter: Payer: Self-pay | Admitting: Gastroenterology

## 2016-07-16 ENCOUNTER — Encounter: Payer: Self-pay | Admitting: Internal Medicine

## 2016-08-29 ENCOUNTER — Ambulatory Visit (INDEPENDENT_AMBULATORY_CARE_PROVIDER_SITE_OTHER): Payer: BLUE CROSS/BLUE SHIELD | Admitting: Gastroenterology

## 2016-08-29 ENCOUNTER — Encounter: Payer: Self-pay | Admitting: Gastroenterology

## 2016-08-29 VITALS — BP 120/84 | HR 83 | Ht 68.0 in | Wt 190.2 lb

## 2016-08-29 DIAGNOSIS — R195 Other fecal abnormalities: Secondary | ICD-10-CM | POA: Diagnosis not present

## 2016-08-29 DIAGNOSIS — R197 Diarrhea, unspecified: Secondary | ICD-10-CM

## 2016-08-29 MED ORDER — NA SULFATE-K SULFATE-MG SULF 17.5-3.13-1.6 GM/177ML PO SOLN: 1.0000 | Freq: Once | ORAL | 0 refills | Status: AC

## 2016-08-29 NOTE — Patient Instructions (Addendum)
Use imodium as needed for diarrhea.  When you are next home from work travel, please do the stool studies as ordered.  You will also have your colonoscopy that week with my partner Dr Carlean Purl because I will be away at that time.  If you are age 65 or older, your body mass index should be between 23-30. Your Body mass index is 28.92 kg/m. If this is out of the aforementioned range listed, please consider follow up with your Primary Care Provider.  If you are age 82 or younger, your body mass index should be between 19-25. Your Body mass index is 28.92 kg/m. If this is out of the aformentioned range listed, please consider follow up with your Primary Care Provider.   You have been scheduled for a colonoscopy. Please follow written instructions given to you at your visit today.  Please pick up your prep supplies at the pharmacy within the next 1-3 days. If you use inhalers (even only as needed), please bring them with you on the day of your procedure. Your physician has requested that you go to www.startemmi.com and enter the access code given to you at your visit today. This web site gives a general overview about your procedure. However, you should still follow specific instructions given to you by our office regarding your preparation for the procedure.  Thank you for choosing [facility]  Dr Wilfrid Lund III

## 2016-08-29 NOTE — Progress Notes (Signed)
[facility] Consult Note:  History: Trevor Ayala 08/29/2016  Referring physician: Glo Herring., MD  Reason for consult/chief complaint: Diarrhea (Patient has never had a colonoscopy); Rectal Bleeding; and Pos Cologaurd (Positive hemoccult through PCP)   Subjective  HPI:  This is a 65 year old man referred to [place] for diarrhea. It seems to have begun several months ago, but it does not bother him every day. On bad days he may have 8 or 10 BMs per day and some small volume rectal bleeding. He denies abdominal pain, but says he often feels nauseated but without vomiting. There is no family history of colorectal cancer or known IBD. He has had no recent weight changes. Bili denies antibiotic use in the few months before symptom onset and denies travel outside the [place]. 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 He denies mouth sores skin rash or swollen joints  ROS:  Review of Systems  Constitutional: Negative for appetite change and unexpected weight change.  HENT: Negative for mouth sores and voice change.   Eyes: Negative for pain and redness.  Respiratory: Negative for cough and shortness of breath.   Cardiovascular: Negative for chest pain and palpitations.  Genitourinary: Negative for dysuria and hematuria.  Musculoskeletal: Negative for arthralgias and myalgias.  Skin: Negative for pallor and rash.  Neurological: Negative for weakness and headaches.  Hematological: Negative for adenopathy.     Past Medical History: Past Medical History:  Diagnosis Date  . Asymptomatic PVCs   . History of kidney stones   . Hypertension   . OSA on CPAP   . Prediabetes    diet controlled  . Renal calculus, left   . Right ureteral stone   . Wears glasses      Past Surgical History: Past Surgical History:  Procedure Laterality Date  . CYSTOSCOPY WITH RETROGRADE PYELOGRAM, URETEROSCOPY AND STENT PLACEMENT Right 10/06/2014   Procedure: CYSTOSCOPY WITH RETROGRADE PYELOGRAM, URETEROSCOPY AND STENT PLACEMENT;  Surgeon: Alexis Frock, MD;  Location[facility];  Service: Urology;  Laterality: Right;  . HOLMIUM LASER APPLICATION Right 44/02/4741   Procedure: HOLMIUM LASER APPLICATION;  Surgeon: Alexis Frock, MD;  Location: [facility];  Service: Urology;  Laterality: Right;  . TRANSTHORACIC ECHOCARDIOGRAM  09-09-2014   mild LVH/  ef 59-56%/  grade I diastolic dysfunction/  mild MR/  mild sclerotic Av     Family History: Family History  Problem Relation Age of Onset  . Cancer Mother   . Cancer Brother     Social History: Social History   Social History  . Marital status: Married    Spouse name: N/A  . Number of children: N/A  . Years of education: N/A   Social History Main Topics  . Smoking status: Never Smoker  . Smokeless tobacco: Never Used  . Alcohol use 0.0 oz/week     Comment: OCCASIONAL  . Drug use: No  . Sexual activity: Not Asked   Other Topics Concern  . None   Social History Narrative  . None    Allergies: No Known Allergies  Outpatient Meds: Current Outpatient Prescriptions  Medication Sig Dispense Refill  . lisinopril (PRINIVIL,ZESTRIL) 20 MG tablet Take 1 tablet (20 mg total) by mouth 2 (two) times daily. 180 tablet 3  . oxyCODONE-acetaminophen (PERCOCET/ROXICET) 5-325 MG per tablet Take 1-2 tablets by mouth every 6 (six) hours as needed for moderate pain or severe pain. Post-operatively 30 tablet 0  .  Na Sulfate-K Sulfate-Mg Sulf 17.5-3.13-1.6 GM/180ML SOLN Take 1 kit by mouth once. 354 mL 0   No current facility-administered medications for this visit.       ___________________________________________________________________ Objective   Exam:  BP 120/84 (BP Location: Left Arm, Patient Position: Sitting, Cuff Size: Large)   Pulse 83   Ht '5\' 8"'$  (1.727 m)   Wt 190 lb 3.2 oz (86.3 kg)   SpO2 98%   BMI 28.92 kg/m    General: this  is a(n) Well-appearing middle-aged man   Eyes: sclera anicteric, no redness  ENT: oral mucosa moist without lesions, no cervical or supraclavicular lymphadenopathy, good dentition  CV: RRR without murmur, S1/S2, no JVD, no peripheral edema  Resp: clear to auscultation bilaterally, normal RR and effort noted  GI: soft, no tenderness, with active bowel sounds. No guarding or palpable organomegaly noted.  Skin; warm and dry, no rash or jaundice noted  Neuro: awake, alert and oriented x 3. Normal gross motor function and fluent speech  PCP Labs:  On 05/31/2016, hemoglobin A1c 8.4 TSH 4.4 CBC normal total bilirubin 1.7 (similar values dating back to at least 2015) alkaline phosphatase 54 AST 14 anal T 15 albumin 4.8 Stool positive for occult blood 2  Assessment: Encounter Diagnoses  Name Primary?  . Diarrhea, unspecified type Yes  . Heme positive stool       Plan:  Stool GI pathogen panel and C. difficile PCR Colonoscopy. He was only able to do it a few weeks from now while he is home from his upcoming work trip. I will be away that week, so I have made arrangements for her to be done with Dr. Carlean Purl. If the mucosa is visibly normal, biopsy should be taken to rule out microscopic colitis. Meanwhile, he will use as needed Imodium  Thank you for the courtesy of this consult.  Please call me with any questions or concerns.  Nelida Meuse III  CC: Glo Herring., MD

## 2016-09-25 ENCOUNTER — Ambulatory Visit (INDEPENDENT_AMBULATORY_CARE_PROVIDER_SITE_OTHER): Payer: BLUE CROSS/BLUE SHIELD | Admitting: Adult Health

## 2016-09-25 ENCOUNTER — Encounter: Payer: Self-pay | Admitting: Adult Health

## 2016-09-25 ENCOUNTER — Encounter: Payer: BLUE CROSS/BLUE SHIELD | Admitting: Internal Medicine

## 2016-09-25 ENCOUNTER — Encounter: Payer: Self-pay | Admitting: *Deleted

## 2016-09-25 VITALS — BP 110/60 | HR 66 | Ht 68.0 in | Wt 185.0 lb

## 2016-09-25 DIAGNOSIS — I10 Essential (primary) hypertension: Secondary | ICD-10-CM

## 2016-09-25 DIAGNOSIS — I493 Ventricular premature depolarization: Secondary | ICD-10-CM

## 2016-09-25 NOTE — Progress Notes (Signed)
Name: Trevor Ayala    DOB: Apr 30, 1951  Age: 65 y.o.  MR#: [id number]       PCP:  Glo Herring., MD      Insurance: Payor: BLUE [employer] / Plan: [employer] / Product Type: *No Product type* /   CC:    Chief Complaint  Patient presents with  . Palpitations  . Hypertension    VS Vitals:   09/25/16 1303  BP: 110/60  Pulse: (!) 166  SpO2: 97%  Weight: 185 lb (83.9 kg)  Height: 5\' 8"  (1.727 m)    Weights Current Weight  09/25/16 185 lb (83.9 kg)  08/29/16 190 lb 3.2 oz (86.3 kg)  10/06/15 194 lb (88 kg)    Blood Pressure  BP Readings from Last 3 Encounters:  09/25/16 110/60  08/29/16 120/84  10/06/15 (!) 141/91     Admit date:  (Not on file) Last encounter with RMR:  Visit date not found   Allergy Review of patient's allergies indicates no known allergies.  Current Outpatient Prescriptions  Medication Sig Dispense Refill  . lisinopril (PRINIVIL,ZESTRIL) 20 MG tablet Take 1 tablet (20 mg total) by mouth 2 (two) times daily. 180 tablet 3  . oxyCODONE-acetaminophen (PERCOCET/ROXICET) 5-325 MG per tablet Take 1-2 tablets by mouth every 6 (six) hours as needed for moderate pain or severe pain. Post-operatively 30 tablet 0   No current facility-administered medications for this visit.     Discontinued Meds:   There are no discontinued medications.  Patient Active Problem List   Diagnosis Date Noted  . PVC's (premature ventricular contractions) 08/27/2014    LABS    Component Value Date/Time   NA 138 10/06/2014 0859   K 4.6 10/06/2014 0859   CL 106 10/06/2014 0859   GLUCOSE 227 (H) 10/06/2014 0859   BUN 32 (H) 10/06/2014 0859   CREATININE 1.70 (H) 10/06/2014 0859   CMP     Component Value Date/Time   NA 138 10/06/2014 0859   K 4.6 10/06/2014 0859   CL 106 10/06/2014 0859   GLUCOSE 227 (H) 10/06/2014 0859   BUN 32 (H) 10/06/2014 0859   CREATININE 1.70 (H) 10/06/2014 0859       Component Value Date/Time   HGB 15.0 10/06/2014 0859   HCT  44.0 10/06/2014 0859    Lipid Panel  No results found for: CHOL, TRIG, HDL, CHOLHDL, VLDL, LDLCALC, LDLDIRECT  ABG    Component Value Date/Time   TCO2 22 10/06/2014 0859     No results found for: TSH BNP (last 3 results) No results for input(s): BNP in the last 8760 hours.  ProBNP (last 3 results) No results for input(s): PROBNP in the last 8760 hours.  Cardiac Panel (last 3 results) No results for input(s): CKTOTAL, CKMB, TROPONINI, RELINDX in the last 72 hours.  Iron/TIBC/Ferritin/ %Sat No results found for: IRON, TIBC, FERRITIN, IRONPCTSAT   EKG Orders placed or performed in visit on 09/25/16  . EKG 12-Lead     Prior Assessment and Plan Problem List as of 09/25/2016 Reviewed: 08/29/2016  1:42 PM by Nelida Meuse III, MD     Cardiovascular and Mediastinum   PVC's (premature ventricular contractions)       Imaging: No results found.

## 2016-09-25 NOTE — Progress Notes (Signed)
Cardiology Office Note   Date:  09/25/2016   ID:  Trevor Ayala, DOB [birth year], MRN [id number]  PCP:  Glo Herring., MD  Cardiologist: Cloria Spring, NP   Chief Complaint  Patient presents with  . Palpitations  . Hypertension    History of Present Illness: Trevor Ayala is a 65 y.o. male who presents for ongoing assessment and management of chronic PVCs, NSVT, and hypertension. The patient was last seen by Dr. Harl Bowie on 10/06/2015. The patient had a Holter monitor placed revealing PVCs and 2 short runs of NSVT. Nuclear stress test was completed and was without significant cardiac disease. Ectopy was found to be benign. Blood pressure was slightly elevated above goal and he was to start taking lisinopril 20 mg twice a day.   He comes today feeling very well. 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 He denies any recurrent chest pain he denies any frequent palpitations or dyspnea. He is requiring another letter from our practice to get to DOT, as he seen him tomorrow.    Past Medical History:  Diagnosis Date  . Asymptomatic PVCs   . History of kidney stones   . Hypertension   . OSA on CPAP   . Prediabetes    diet controlled  . Renal calculus, left   . Right ureteral stone   . Wears glasses     Past Surgical History:  Procedure Laterality Date  . CYSTOSCOPY WITH RETROGRADE PYELOGRAM, URETEROSCOPY AND STENT PLACEMENT Right 10/06/2014   Procedure: CYSTOSCOPY WITH RETROGRADE PYELOGRAM, URETEROSCOPY AND STENT PLACEMENT;  Surgeon: Alexis Frock, MD;  Location: [facility];  Service: Urology;  Laterality: Right;  . HOLMIUM LASER APPLICATION Right 71/69/6789   Procedure: HOLMIUM LASER APPLICATION;  Surgeon: Alexis Frock, MD;  Location: [facility];  Service: Urology;  Laterality: Right;  . TRANSTHORACIC ECHOCARDIOGRAM  09-09-2014   mild LVH/  ef 38-10%/  grade I diastolic dysfunction/  mild  MR/  mild sclerotic Av     Current Outpatient Prescriptions  Medication Sig Dispense Refill  . lisinopril (PRINIVIL,ZESTRIL) 20 MG tablet Take 1 tablet (20 mg total) by mouth 2 (two) times daily. 180 tablet 3  . oxyCODONE-acetaminophen (PERCOCET/ROXICET) 5-325 MG per tablet Take 1-2 tablets by mouth every 6 (six) hours as needed for moderate pain or severe pain. Post-operatively 30 tablet 0   No current facility-administered medications for this visit.     Allergies:   Review of patient's allergies indicates no known allergies.    Social History:  The patient  reports that he has never smoked. He has never used smokeless tobacco. He reports that he drinks alcohol. He reports that he does not use drugs.   Family History:  The patient's family history includes Cancer in his brother and mother.    ROS: All other systems are reviewed and negative. Unless otherwise mentioned in H&P    PHYSICAL EXAM: VS:  BP 110/60   Pulse 66   Ht 5\' 8"  (1.727 m)   Wt 185 lb (83.9 kg)   SpO2 97%   BMI 28.13 kg/m  , BMI Body mass index is 28.13 kg/m. GEN: Well nourished, well developed, in no acute distress  HEENT: normal  Neck: no JVD, carotid bruits, or masses Cardiac: RRR ; no murmurs, rubs, or gallops,no edema  Respiratory:  clear to auscultation bilaterally, normal work of breathing GI: soft, nontender, nondistended, + BS MS: no deformity  or atrophy  Skin: warm and dry, no rash Neuro:  Strength and sensation are intact Psych: euthymic mood, full affect   EKG:  EKGThe ekg ordered today demonstrates normal sinus rhythm with PVC.   Recent Labs: No results found for requested labs within last 8760 hours.    Lipid Panel No results found for: CHOL, TRIG, HDL, CHOLHDL, VLDL, LDLCALC, LDLDIRECT    Wt Readings from Last 3 Encounters:  09/25/16 185 lb (83.9 kg)  08/29/16 190 lb 3.2 oz (86.3 kg)  10/06/15 194 lb (88 kg)     ASSESSMENT AND PLAN:  1. Hypertension: Excellent control of  blood pressure today. I will continue his lisinopril 20 mg twice a day. He has lost approximate 35 pounds. I told her to keep up with his blood pressures he continues to lose weight through diet and exercise. May need to decrease the dose of lisinopril to 20 mg daily to prevent hypotension. 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 The patient is also to report any dizziness or weakness. This may be the result of hypotension. For now we will leave his medications the same. 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  2. Palpitations: EKG revealed one PVC. Will not make any changes in his regimen at this time. He is asymptomatic with this and they were found to be benign.    Current medicines are reviewed at length with the patient today.    Labs/ tests ordered today include: None   Orders Placed This Encounter  Procedures  . EKG 12-Lead     Disposition:   FU with One year  Signed, Jory Sims, NP  09/25/2016 2:17 PM    [facility] [address], [place], [place] [address] Phone: [phone]; Fax: [phone]

## 2016-09-25 NOTE — Patient Instructions (Signed)
Your physician wants you to follow-up in: 1 Year with Dr. Harl Bowie. You will receive a reminder letter in the mail two months in advance. If you don't receive a letter, please call our office to schedule the follow-up appointment.  Your physician recommends that you continue on your current medications as directed. Please refer to the Current Medication list given to you today.  You have been given a letter to continue driving.   If you need a refill on your cardiac medications before your next appointment, please call your pharmacy.  Thank you for choosing [facility]!

## 2017-02-01 DIAGNOSIS — Z1389 Encounter for screening for other disorder: Secondary | ICD-10-CM | POA: Diagnosis not present

## 2017-02-01 DIAGNOSIS — H6123 Impacted cerumen, bilateral: Secondary | ICD-10-CM | POA: Diagnosis not present

## 2017-02-01 DIAGNOSIS — I10 Essential (primary) hypertension: Secondary | ICD-10-CM | POA: Diagnosis not present

## 2017-02-01 DIAGNOSIS — E119 Type 2 diabetes mellitus without complications: Secondary | ICD-10-CM | POA: Diagnosis not present

## 2017-02-01 DIAGNOSIS — Z6827 Body mass index (BMI) 27.0-27.9, adult: Secondary | ICD-10-CM | POA: Diagnosis not present

## 2017-05-16 DIAGNOSIS — G894 Chronic pain syndrome: Secondary | ICD-10-CM | POA: Diagnosis not present

## 2017-05-16 DIAGNOSIS — I10 Essential (primary) hypertension: Secondary | ICD-10-CM | POA: Diagnosis not present

## 2017-05-16 DIAGNOSIS — E663 Overweight: Secondary | ICD-10-CM | POA: Diagnosis not present

## 2017-05-16 DIAGNOSIS — E782 Mixed hyperlipidemia: Secondary | ICD-10-CM | POA: Diagnosis not present

## 2017-05-16 DIAGNOSIS — E1129 Type 2 diabetes mellitus with other diabetic kidney complication: Secondary | ICD-10-CM | POA: Diagnosis not present

## 2017-05-16 DIAGNOSIS — Z6828 Body mass index (BMI) 28.0-28.9, adult: Secondary | ICD-10-CM | POA: Diagnosis not present

## 2017-05-16 DIAGNOSIS — Z1389 Encounter for screening for other disorder: Secondary | ICD-10-CM | POA: Diagnosis not present

## 2017-06-06 DIAGNOSIS — N200 Calculus of kidney: Secondary | ICD-10-CM | POA: Diagnosis not present

## 2017-06-06 DIAGNOSIS — R809 Proteinuria, unspecified: Secondary | ICD-10-CM | POA: Diagnosis not present

## 2017-06-06 DIAGNOSIS — E1129 Type 2 diabetes mellitus with other diabetic kidney complication: Secondary | ICD-10-CM | POA: Diagnosis not present

## 2017-06-06 DIAGNOSIS — I10 Essential (primary) hypertension: Secondary | ICD-10-CM | POA: Diagnosis not present

## 2017-06-06 DIAGNOSIS — N189 Chronic kidney disease, unspecified: Secondary | ICD-10-CM | POA: Diagnosis not present

## 2017-06-14 ENCOUNTER — Other Ambulatory Visit (HOSPITAL_COMMUNITY): Payer: Self-pay | Admitting: Nephrology

## 2017-06-14 DIAGNOSIS — N183 Chronic kidney disease, stage 3 unspecified: Secondary | ICD-10-CM

## 2017-07-02 ENCOUNTER — Ambulatory Visit (HOSPITAL_COMMUNITY)
Admission: RE | Admit: 2017-07-02 | Discharge: 2017-07-02 | Disposition: A | Discharge status: Home / Self Care | Payer: Medicare HMO | Source: Ambulatory Visit | Attending: Nephrology | Admitting: Nephrology

## 2017-07-02 DIAGNOSIS — R809 Proteinuria, unspecified: Secondary | ICD-10-CM | POA: Diagnosis not present

## 2017-07-02 DIAGNOSIS — N1330 Unspecified hydronephrosis: Secondary | ICD-10-CM | POA: Insufficient documentation

## 2017-07-02 DIAGNOSIS — Z79899 Other long term (current) drug therapy: Secondary | ICD-10-CM | POA: Diagnosis not present

## 2017-07-02 DIAGNOSIS — N183 Chronic kidney disease, stage 3 unspecified: Secondary | ICD-10-CM

## 2017-07-02 DIAGNOSIS — I10 Essential (primary) hypertension: Secondary | ICD-10-CM | POA: Diagnosis not present

## 2017-07-02 DIAGNOSIS — D509 Iron deficiency anemia, unspecified: Secondary | ICD-10-CM | POA: Diagnosis not present

## 2017-07-02 DIAGNOSIS — N200 Calculus of kidney: Secondary | ICD-10-CM | POA: Diagnosis not present

## 2017-07-02 DIAGNOSIS — E559 Vitamin D deficiency, unspecified: Secondary | ICD-10-CM | POA: Diagnosis not present

## 2017-07-02 DIAGNOSIS — Z1159 Encounter for screening for other viral diseases: Secondary | ICD-10-CM | POA: Diagnosis not present

## 2017-07-23 DIAGNOSIS — E1129 Type 2 diabetes mellitus with other diabetic kidney complication: Secondary | ICD-10-CM | POA: Diagnosis not present

## 2017-07-23 DIAGNOSIS — R809 Proteinuria, unspecified: Secondary | ICD-10-CM | POA: Diagnosis not present

## 2017-07-23 DIAGNOSIS — N200 Calculus of kidney: Secondary | ICD-10-CM | POA: Diagnosis not present

## 2017-07-23 DIAGNOSIS — N183 Chronic kidney disease, stage 3 (moderate): Secondary | ICD-10-CM | POA: Diagnosis not present

## 2017-07-30 DIAGNOSIS — Z6827 Body mass index (BMI) 27.0-27.9, adult: Secondary | ICD-10-CM | POA: Diagnosis not present

## 2017-07-30 DIAGNOSIS — E1129 Type 2 diabetes mellitus with other diabetic kidney complication: Secondary | ICD-10-CM | POA: Diagnosis not present

## 2017-07-30 DIAGNOSIS — G894 Chronic pain syndrome: Secondary | ICD-10-CM | POA: Diagnosis not present

## 2017-07-30 DIAGNOSIS — N184 Chronic kidney disease, stage 4 (severe): Secondary | ICD-10-CM | POA: Diagnosis not present

## 2017-08-10 DIAGNOSIS — Z1211 Encounter for screening for malignant neoplasm of colon: Secondary | ICD-10-CM | POA: Diagnosis not present

## 2017-09-04 DIAGNOSIS — R109 Unspecified abdominal pain: Secondary | ICD-10-CM | POA: Diagnosis not present

## 2017-09-04 DIAGNOSIS — N200 Calculus of kidney: Secondary | ICD-10-CM | POA: Diagnosis not present

## 2017-09-04 DIAGNOSIS — N132 Hydronephrosis with renal and ureteral calculous obstruction: Secondary | ICD-10-CM | POA: Diagnosis not present

## 2017-09-13 ENCOUNTER — Other Ambulatory Visit: Payer: Self-pay | Admitting: Urology

## 2017-09-16 DIAGNOSIS — N189 Chronic kidney disease, unspecified: Secondary | ICD-10-CM | POA: Diagnosis not present

## 2017-09-16 DIAGNOSIS — R34 Anuria and oliguria: Secondary | ICD-10-CM | POA: Diagnosis not present

## 2017-09-16 DIAGNOSIS — N200 Calculus of kidney: Secondary | ICD-10-CM | POA: Diagnosis not present

## 2017-09-16 DIAGNOSIS — N132 Hydronephrosis with renal and ureteral calculous obstruction: Secondary | ICD-10-CM | POA: Diagnosis not present

## 2017-10-11 NOTE — Patient Instructions (Signed)
CORDAY WYKA  10/11/2017   Your procedure is scheduled on: 10-16-17  Report to [facility] Main  Entrance Take [place]  elevators to 3rd floor to  [facility] [place].    Call this number if you have problems the morning of surgery [phone]    Remember: ONLY 1 PERSON MAY GO WITH YOU TO SHORT STAY TO GET  READY MORNING OF [place].  Do not eat food After Midnight on Monday 10-14-17. Drink plenty of clear liquids all day Tuesday 10-15-17 and follow all bowel prep instructions from your surgeon. You may continue clear liquid diet until 7am day of surgery. Nothing by mouth after 7am!!     Take these medicines the morning of surgery with A SIP OF WATER: amlodipine(norvasc), oxycodone if needed                                You may not have any metal on your body including hair pins and              piercings  Do not wear jewelry, make-up, lotions, powders or perfumes, deodorant              Men may shave face and neck.   Do not bring valuables to the hospital. [place].  Contacts, dentures or bridgework may not be worn into surgery.  Leave suitcase in the car. After surgery it may be brought to your room.                Please read over the following fact sheets you were given: _____________________________________________________________________   CLEAR LIQUID DIET   Foods Allowed                                                                     Foods Excluded  Coffee and tea, regular and decaf                             liquids that you cannot  Plain Jell-O in any flavor                                             see through such as: Fruit ices (not with fruit pulp)                                     milk, soups, orange juice  Iced Popsicles                                    All solid food Carbonated beverages, regular and diet  Cranberry, grape and  apple juices Sports drinks like Gatorade Lightly seasoned clear broth or consume(fat free) Sugar, honey syrup  Sample Menu Breakfast                                Lunch                                     Supper Cranberry juice                    Beef broth                            Chicken broth Jell-O                                     Grape juice                           Apple juice Coffee or tea                        Jell-O                                      Popsicle                                                Coffee or tea                        Coffee or tea  _____________________________________________________________________  [facility] - Preparing for Surgery Before surgery, you can play an important role.  Because skin is not sterile, your skin needs to be as free of germs as possible.  You can reduce the number of germs on your skin by washing with CHG (chlorahexidine gluconate) soap before surgery.  CHG is an antiseptic cleaner which kills germs and bonds with the skin to continue killing germs even after washing. Please DO NOT use if you have an allergy to CHG or antibacterial soaps.  If your skin becomes reddened/irritated stop using the CHG and inform your nurse when you arrive at Short Stay. Do not shave (including legs and underarms) for at least 48 hours prior to the first CHG shower.  You may shave your face/neck. Please follow these instructions carefully:  1.  Shower with CHG Soap the night before surgery and the  morning of Surgery.  2.  If you choose to wash your hair, wash your hair first as usual with your  normal  shampoo.  3.  After you shampoo, rinse your hair and body thoroughly to remove the  shampoo.                           4.  Use CHG as you would any other liquid soap.  You can apply chg directly  to the skin and wash  Gently with a scrungie or clean washcloth.  5.  Apply the CHG Soap to your body ONLY FROM THE NECK DOWN.   Do  not use on face/ open                           Wound or open sores. Avoid contact with eyes, ears mouth and genitals (private parts).                       Wash face,  Genitals (private parts) with your normal soap.             6.  Wash thoroughly, paying special attention to the area where your surgery  will be performed.  7.  Thoroughly rinse your body with warm water from the neck down.  8.  DO NOT shower/wash with your normal soap after using and rinsing off  the CHG Soap.                9.  Pat yourself dry with a clean towel.            10.  Wear clean pajamas.            11.  Place clean sheets on your bed the night of your first shower and do not  sleep with pets. Day of Surgery : Do not apply any lotions/deodorants the morning of surgery.  Please wear clean clothes to the hospital/surgery center.  FAILURE TO FOLLOW THESE INSTRUCTIONS MAY RESULT IN THE CANCELLATION OF YOUR SURGERY PATIENT SIGNATURE_________________________________  NURSE SIGNATURE__________________________________  ________________________________________________________________________

## 2017-10-11 NOTE — Progress Notes (Signed)
[facility] Jory Sims NP 09-25-16 epic  Stress Test/ECHO 2015 epic

## 2017-10-14 ENCOUNTER — Encounter (HOSPITAL_COMMUNITY)
Admission: RE | Admit: 2017-10-14 | Discharge: 2017-10-14 | Disposition: A | Discharge status: Home / Self Care | Payer: Medicare HMO | Source: Ambulatory Visit | Attending: Urology | Admitting: Urology

## 2017-10-14 ENCOUNTER — Other Ambulatory Visit: Payer: Self-pay

## 2017-10-14 ENCOUNTER — Encounter (HOSPITAL_COMMUNITY): Payer: Self-pay

## 2017-10-14 DIAGNOSIS — I10 Essential (primary) hypertension: Secondary | ICD-10-CM | POA: Diagnosis not present

## 2017-10-14 DIAGNOSIS — Z87442 Personal history of urinary calculi: Secondary | ICD-10-CM | POA: Diagnosis not present

## 2017-10-14 DIAGNOSIS — G4733 Obstructive sleep apnea (adult) (pediatric): Secondary | ICD-10-CM | POA: Diagnosis not present

## 2017-10-14 DIAGNOSIS — G4730 Sleep apnea, unspecified: Secondary | ICD-10-CM | POA: Diagnosis not present

## 2017-10-14 DIAGNOSIS — Z79899 Other long term (current) drug therapy: Secondary | ICD-10-CM | POA: Diagnosis not present

## 2017-10-14 DIAGNOSIS — N132 Hydronephrosis with renal and ureteral calculous obstruction: Secondary | ICD-10-CM | POA: Diagnosis not present

## 2017-10-14 HISTORY — DX: Prediabetes: R73.03

## 2017-10-14 LAB — BASIC METABOLIC PANEL
ANION GAP: 11 (ref 5–15)
BUN: 19 mg/dL (ref 6–20)
CALCIUM: 9.4 mg/dL (ref 8.9–10.3)
CO2: 21 mmol/L — ABNORMAL LOW (ref 22–32)
CREATININE: 1.81 mg/dL — AB (ref 0.61–1.24)
Chloride: 108 mmol/L (ref 101–111)
GFR, EST AFRICAN AMERICAN: 43 mL/min — AB (ref >60)
GFR, EST NON AFRICAN AMERICAN: 37 mL/min — AB (ref >60)
Glucose, Bld: 186 mg/dL — ABNORMAL HIGH (ref 65–99)
Potassium: 4 mmol/L (ref 3.5–5.1)
SODIUM: 140 mmol/L (ref 135–145)

## 2017-10-14 LAB — CBC
HCT: 42.6 % (ref 39.0–52.0)
Hemoglobin: 15.1 g/dL (ref 13.0–17.0)
MCH: 30.1 pg (ref 26.0–34.0)
MCHC: 35.4 g/dL (ref 30.0–36.0)
MCV: 85 fL (ref 78.0–100.0)
PLATELETS: 181 10*3/uL (ref 150–400)
RBC: 5.01 MIL/uL (ref 4.22–5.81)
RDW: 13.3 % (ref 11.5–15.5)
WBC: 7.3 10*3/uL (ref 4.0–10.5)

## 2017-10-14 LAB — HEMOGLOBIN A1C
Hgb A1c MFr Bld: 6.9 % — ABNORMAL HIGH (ref 4.8–5.6)
MEAN PLASMA GLUCOSE: 151.33 mg/dL

## 2017-10-14 NOTE — Progress Notes (Signed)
BMP RESULT ROUTED VIA Epic TO DR Clint Lipps

## 2017-10-15 MED ORDER — GENTAMICIN SULFATE 40 MG/ML IJ SOLN
360.0000 mg | Freq: Once | INTRAVENOUS | Status: AC
  Administered 2017-10-16: 360 mg via INTRAVENOUS
  Filled 2017-10-15: qty 9

## 2017-10-16 ENCOUNTER — Ambulatory Visit (HOSPITAL_COMMUNITY): Payer: Medicare HMO

## 2017-10-16 ENCOUNTER — Encounter (HOSPITAL_COMMUNITY)
Admission: RE | Disposition: A | Discharge status: Home / Self Care | Payer: Self-pay | Source: Ambulatory Visit | Attending: Urology

## 2017-10-16 ENCOUNTER — Observation Stay (HOSPITAL_COMMUNITY)
Admission: RE | Admit: 2017-10-16 | Discharge: 2017-10-17 | Disposition: A | Discharge status: Home / Self Care | Payer: Medicare HMO | Source: Ambulatory Visit | Attending: Urology | Admitting: Urology

## 2017-10-16 ENCOUNTER — Ambulatory Visit (HOSPITAL_COMMUNITY): Payer: Medicare HMO | Admitting: Anesthesiology

## 2017-10-16 ENCOUNTER — Other Ambulatory Visit: Payer: Self-pay

## 2017-10-16 ENCOUNTER — Encounter (HOSPITAL_COMMUNITY): Payer: Self-pay | Admitting: Emergency Medicine

## 2017-10-16 DIAGNOSIS — Z79899 Other long term (current) drug therapy: Secondary | ICD-10-CM | POA: Diagnosis not present

## 2017-10-16 DIAGNOSIS — Z87442 Personal history of urinary calculi: Secondary | ICD-10-CM | POA: Diagnosis not present

## 2017-10-16 DIAGNOSIS — I10 Essential (primary) hypertension: Secondary | ICD-10-CM | POA: Insufficient documentation

## 2017-10-16 DIAGNOSIS — G4730 Sleep apnea, unspecified: Secondary | ICD-10-CM | POA: Diagnosis not present

## 2017-10-16 DIAGNOSIS — N202 Calculus of kidney with calculus of ureter: Secondary | ICD-10-CM | POA: Diagnosis not present

## 2017-10-16 DIAGNOSIS — I493 Ventricular premature depolarization: Secondary | ICD-10-CM | POA: Diagnosis not present

## 2017-10-16 DIAGNOSIS — G4733 Obstructive sleep apnea (adult) (pediatric): Secondary | ICD-10-CM | POA: Insufficient documentation

## 2017-10-16 DIAGNOSIS — N132 Hydronephrosis with renal and ureteral calculous obstruction: Secondary | ICD-10-CM | POA: Diagnosis not present

## 2017-10-16 DIAGNOSIS — N200 Calculus of kidney: Secondary | ICD-10-CM

## 2017-10-16 HISTORY — PX: HOLMIUM LASER APPLICATION: SHX5852

## 2017-10-16 HISTORY — PX: CYSTOSCOPY W/ RETROGRADES: SHX1426

## 2017-10-16 HISTORY — PX: NEPHROLITHOTOMY: SHX5134

## 2017-10-16 LAB — HEMOGLOBIN AND HEMATOCRIT, BLOOD
HCT: 43 % (ref 39.0–52.0)
HEMOGLOBIN: 14.6 g/dL (ref 13.0–17.0)

## 2017-10-16 SURGERY — NEPHROLITHOTOMY PERCUTANEOUS
Anesthesia: General | Laterality: Left

## 2017-10-16 MED ORDER — ONDANSETRON HCL 4 MG/2ML IJ SOLN
INTRAMUSCULAR | Status: DC | PRN
  Administered 2017-10-16: 4 mg via INTRAVENOUS

## 2017-10-16 MED ORDER — MAGNESIUM CITRATE PO SOLN: 1.0000 | Freq: Once | ORAL | Status: DC

## 2017-10-16 MED ORDER — SENNOSIDES-DOCUSATE SODIUM 8.6-50 MG PO TABS: 1.0000 | ORAL_TABLET | Freq: Every evening | ORAL | Status: DC | PRN

## 2017-10-16 MED ORDER — IOHEXOL 300 MG/ML  SOLN
INTRAMUSCULAR | Status: DC | PRN
  Administered 2017-10-16: 90 mL

## 2017-10-16 MED ORDER — EPHEDRINE SULFATE-NACL 50-0.9 MG/10ML-% IV SOSY
PREFILLED_SYRINGE | INTRAVENOUS | Status: DC | PRN
  Administered 2017-10-16: 10 mg via INTRAVENOUS
  Administered 2017-10-16: 5 mg via INTRAVENOUS

## 2017-10-16 MED ORDER — PROPOFOL 10 MG/ML IV BOLUS
INTRAVENOUS | Status: AC
  Filled 2017-10-16: qty 20

## 2017-10-16 MED ORDER — MORPHINE SULFATE (PF) 4 MG/ML IV SOLN: 2.0000 mg | INTRAVENOUS | Status: DC | PRN

## 2017-10-16 MED ORDER — SUGAMMADEX SODIUM 200 MG/2ML IV SOLN
INTRAVENOUS | Status: DC | PRN
  Administered 2017-10-16: 175 mg via INTRAVENOUS

## 2017-10-16 MED ORDER — AMLODIPINE BESYLATE 10 MG PO TABS
10.0000 mg | ORAL_TABLET | Freq: Every day | ORAL | Status: DC
  Administered 2017-10-17: 10 mg via ORAL
  Filled 2017-10-16: qty 1

## 2017-10-16 MED ORDER — SUGAMMADEX SODIUM 200 MG/2ML IV SOLN
INTRAVENOUS | Status: AC
  Filled 2017-10-16: qty 2

## 2017-10-16 MED ORDER — LACTATED RINGERS IV SOLN
INTRAVENOUS | Status: DC
  Administered 2017-10-16 (×2): via INTRAVENOUS

## 2017-10-16 MED ORDER — ACETAMINOPHEN 325 MG PO TABS
650.0000 mg | ORAL_TABLET | Freq: Four times a day (QID) | ORAL | Status: DC
  Administered 2017-10-16 – 2017-10-17 (×3): 650 mg via ORAL
  Filled 2017-10-16 (×4): qty 2

## 2017-10-16 MED ORDER — LIDOCAINE 2% (20 MG/ML) 5 ML SYRINGE
INTRAMUSCULAR | Status: DC | PRN
  Administered 2017-10-16: 100 mg via INTRAVENOUS

## 2017-10-16 MED ORDER — SODIUM CHLORIDE 0.9 % IR SOLN
Status: DC | PRN
  Administered 2017-10-16: 1000 mL

## 2017-10-16 MED ORDER — FENTANYL CITRATE (PF) 100 MCG/2ML IJ SOLN
INTRAMUSCULAR | Status: DC | PRN
  Administered 2017-10-16 (×5): 50 ug via INTRAVENOUS

## 2017-10-16 MED ORDER — HYDROMORPHONE HCL 1 MG/ML IJ SOLN
INTRAMUSCULAR | Status: AC
  Administered 2017-10-16: 0.5 mg via INTRAVENOUS
  Filled 2017-10-16: qty 1

## 2017-10-16 MED ORDER — HYDROMORPHONE HCL 1 MG/ML IJ SOLN
0.2500 mg | INTRAMUSCULAR | Status: DC | PRN
  Administered 2017-10-16 (×2): 0.5 mg via INTRAVENOUS

## 2017-10-16 MED ORDER — DEXAMETHASONE SODIUM PHOSPHATE 10 MG/ML IJ SOLN
INTRAMUSCULAR | Status: DC | PRN
  Administered 2017-10-16: 10 mg via INTRAVENOUS

## 2017-10-16 MED ORDER — MIDAZOLAM HCL 5 MG/5ML IJ SOLN
INTRAMUSCULAR | Status: DC | PRN
  Administered 2017-10-16: 2 mg via INTRAVENOUS

## 2017-10-16 MED ORDER — MIDAZOLAM HCL 2 MG/2ML IJ SOLN
INTRAMUSCULAR | Status: AC
  Filled 2017-10-16: qty 2

## 2017-10-16 MED ORDER — ROCURONIUM BROMIDE 50 MG/5ML IV SOSY
PREFILLED_SYRINGE | INTRAVENOUS | Status: AC
  Filled 2017-10-16: qty 5

## 2017-10-16 MED ORDER — OXYCODONE HCL 5 MG PO TABS: 5.0000 mg | ORAL_TABLET | ORAL | Status: DC | PRN

## 2017-10-16 MED ORDER — DEXTROSE-NACL 5-0.45 % IV SOLN
INTRAVENOUS | Status: DC
  Administered 2017-10-16: 17:00:00 via INTRAVENOUS

## 2017-10-16 MED ORDER — ROCURONIUM BROMIDE 10 MG/ML (PF) SYRINGE
PREFILLED_SYRINGE | INTRAVENOUS | Status: DC | PRN
  Administered 2017-10-16 (×2): 10 mg via INTRAVENOUS
  Administered 2017-10-16: 50 mg via INTRAVENOUS

## 2017-10-16 MED ORDER — 0.9 % SODIUM CHLORIDE (POUR BTL) OPTIME
TOPICAL | Status: DC | PRN
  Administered 2017-10-16: 1000 mL

## 2017-10-16 MED ORDER — SODIUM CHLORIDE 0.9 % IR SOLN
Status: DC | PRN
  Administered 2017-10-16: 12000 mL

## 2017-10-16 MED ORDER — PROMETHAZINE HCL 25 MG/ML IJ SOLN: 6.2500 mg | INTRAMUSCULAR | Status: DC | PRN

## 2017-10-16 MED ORDER — ONDANSETRON HCL 4 MG/2ML IJ SOLN: 4.0000 mg | INTRAMUSCULAR | Status: DC | PRN

## 2017-10-16 MED ORDER — EPHEDRINE 5 MG/ML INJ
INTRAVENOUS | Status: AC
  Filled 2017-10-16: qty 10

## 2017-10-16 MED ORDER — DEXAMETHASONE SODIUM PHOSPHATE 10 MG/ML IJ SOLN
INTRAMUSCULAR | Status: AC
  Filled 2017-10-16: qty 1

## 2017-10-16 MED ORDER — ONDANSETRON HCL 4 MG/2ML IJ SOLN
INTRAMUSCULAR | Status: AC
  Filled 2017-10-16: qty 2

## 2017-10-16 MED ORDER — FENTANYL CITRATE (PF) 250 MCG/5ML IJ SOLN
INTRAMUSCULAR | Status: AC
  Filled 2017-10-16: qty 5

## 2017-10-16 MED ORDER — LIDOCAINE 2% (20 MG/ML) 5 ML SYRINGE
INTRAMUSCULAR | Status: AC
  Filled 2017-10-16: qty 5

## 2017-10-16 MED ORDER — PROPOFOL 10 MG/ML IV BOLUS
INTRAVENOUS | Status: DC | PRN
  Administered 2017-10-16: 160 mg via INTRAVENOUS

## 2017-10-16 SURGICAL SUPPLY — 79 items
APL SKNCLS STERI-STRIP NONHPOA (GAUZE/BANDAGES/DRESSINGS) ×2
BAG URINE DRAINAGE (UROLOGICAL SUPPLIES) ×4 IMPLANT
BAG URO CATCHER STRL LF (MISCELLANEOUS) ×2 IMPLANT
BASKET LASER NITINOL 1.9FR (BASKET) ×2 IMPLANT
BASKET ZERO TIP NITINOL 2.4FR (BASKET) ×2 IMPLANT
BENZOIN TINCTURE PRP APPL 2/3 (GAUZE/BANDAGES/DRESSINGS) ×4 IMPLANT
BLADE SURG 15 STRL LF DISP TIS (BLADE) ×1 IMPLANT
BLADE SURG 15 STRL SS (BLADE) ×2
BSKT STON RTRVL 120 1.9FR (BASKET) ×1
BSKT STON RTRVL ZERO TP 2.4FR (BASKET) ×1
CATCHER STONE W/TUBE ADAPTER (UROLOGICAL SUPPLIES) ×1 IMPLANT
CATH FOLEY 2W COUNCIL 20FR 5CC (CATHETERS) IMPLANT
CATH FOLEY 2WAY SLVR  5CC 16FR (CATHETERS) ×1
CATH FOLEY 2WAY SLVR 5CC 16FR (CATHETERS) ×1 IMPLANT
CATH IMAGER II 65CM (CATHETERS) ×2 IMPLANT
CATH INTERMIT  6FR 70CM (CATHETERS) ×2 IMPLANT
CATH MULTI PURPOSE 16FR DRAIN (STENTS) ×2 IMPLANT
CATH ROBINSON RED A/P 20FR (CATHETERS) IMPLANT
CATH ULTRATHANE 14FR (CATHETERS) ×2 IMPLANT
CATH UROLOGY TORQUE 40 (MISCELLANEOUS) ×1 IMPLANT
CATH X-FORCE N30 NEPHROSTOMY (TUBING) ×2 IMPLANT
CHLORAPREP W/TINT 26ML (MISCELLANEOUS) ×4 IMPLANT
CLOTH BEACON ORANGE TIMEOUT ST (SAFETY) ×2 IMPLANT
COVER FOOTSWITCH UNIV (MISCELLANEOUS) IMPLANT
COVER SURGICAL LIGHT HANDLE (MISCELLANEOUS) ×2 IMPLANT
DRAPE C-ARM 42X120 X-RAY (DRAPES) ×2 IMPLANT
DRAPE LG THREE QUARTER DISP (DRAPES) ×2 IMPLANT
DRAPE LINGEMAN PERC (DRAPES) ×2 IMPLANT
DRAPE SHEET LG 3/4 BI-LAMINATE (DRAPES) ×2 IMPLANT
DRAPE SURG IRRIG POUCH 19X23 (DRAPES) ×2 IMPLANT
DRSG PAD ABDOMINAL 8X10 ST (GAUZE/BANDAGES/DRESSINGS) ×4 IMPLANT
DRSG TEGADERM 4X4.75 (GAUZE/BANDAGES/DRESSINGS) ×1 IMPLANT
DRSG TEGADERM 8X12 (GAUZE/BANDAGES/DRESSINGS) ×4 IMPLANT
FIBER LASER FLEXIVA 1000 (UROLOGICAL SUPPLIES) IMPLANT
FIBER LASER FLEXIVA 365 (UROLOGICAL SUPPLIES) IMPLANT
FIBER LASER FLEXIVA 550 (UROLOGICAL SUPPLIES) IMPLANT
FIBER LASER TRAC TIP (UROLOGICAL SUPPLIES) ×1 IMPLANT
FLOSEAL 10ML (HEMOSTASIS) ×1 IMPLANT
GAUZE SPONGE 4X4 12PLY STRL (GAUZE/BANDAGES/DRESSINGS) ×2 IMPLANT
GLOVE BIOGEL M STRL SZ7.5 (GLOVE) ×6 IMPLANT
GOWN STRL REUS W/TWL LRG LVL3 (GOWN DISPOSABLE) ×4 IMPLANT
GUIDEWIRE AMPLAZ .035X145 (WIRE) ×1 IMPLANT
GUIDEWIRE ANG ZIPWIRE 038X150 (WIRE) ×4 IMPLANT
GUIDEWIRE STR DUAL SENSOR (WIRE) ×3 IMPLANT
IV NS 1000ML (IV SOLUTION) ×2
IV NS 1000ML BAXH (IV SOLUTION) ×1 IMPLANT
IV SET EXTENSION CATH 6 NF (IV SETS) ×2 IMPLANT
KIT BASIN OR (CUSTOM PROCEDURE TRAY) ×2 IMPLANT
MANIFOLD NEPTUNE II (INSTRUMENTS) ×2 IMPLANT
NDL TROCAR 18X15 ECHO (NEEDLE) IMPLANT
NDL TROCAR 18X20 (NEEDLE) IMPLANT
NEEDLE TROCAR 18X15 ECHO (NEEDLE) ×2 IMPLANT
NEEDLE TROCAR 18X20 (NEEDLE) IMPLANT
NS IRRIG 1000ML POUR BTL (IV SOLUTION) ×2 IMPLANT
PACK CYSTO (CUSTOM PROCEDURE TRAY) ×2 IMPLANT
PROBE LITHOCLAST ULTRA 3.8X403 (UROLOGICAL SUPPLIES) IMPLANT
PROBE PNEUMATIC 1.0MMX570MM (UROLOGICAL SUPPLIES) ×2 IMPLANT
SET IRRIG Y TYPE TUR BLADDER L (SET/KITS/TRAYS/PACK) ×2 IMPLANT
SHEATH ACCESS URETERAL 24CM (SHEATH) IMPLANT
SHEATH ACCESS URETERAL 54CM (SHEATH) IMPLANT
SHEATH PEELAWAY SET 9 (SHEATH) ×2 IMPLANT
SHEATH URETERAL 12FRX35CM (MISCELLANEOUS) IMPLANT
SPONGE LAP 4X18 X RAY DECT (DISPOSABLE) ×2 IMPLANT
STENT URET 6FRX26 CONTOUR (STENTS) ×1 IMPLANT
STONE CATCHER W/TUBE ADAPTER (UROLOGICAL SUPPLIES) ×2 IMPLANT
SUT SILK 2 0 30  PSL (SUTURE) ×1
SUT SILK 2 0 30 PSL (SUTURE) ×1 IMPLANT
SUT VIC AB 0 CT2 27 (SUTURE) ×1 IMPLANT
SYR 10ML LL (SYRINGE) ×2 IMPLANT
SYR 20CC LL (SYRINGE) ×4 IMPLANT
SYR 50ML LL SCALE MARK (SYRINGE) ×2 IMPLANT
SYR CONTROL 10ML LL (SYRINGE) ×2 IMPLANT
TOWEL OR 17X26 10 PK STRL BLUE (TOWEL DISPOSABLE) ×2 IMPLANT
TRAY FOLEY W/METER SILVER 16FR (SET/KITS/TRAYS/PACK) ×2 IMPLANT
TUBE CONNECTING VINYL 14FR 30C (MISCELLANEOUS) ×2 IMPLANT
TUBE FEEDING 8FR 16IN STR KANG (MISCELLANEOUS) ×2 IMPLANT
TUBING CONNECTING 10 (TUBING) ×6 IMPLANT
WATER STERILE IRR 1500ML POUR (IV SOLUTION) ×2 IMPLANT
WATER STERILE IRR 3000ML UROMA (IV SOLUTION) ×2 IMPLANT

## 2017-10-16 NOTE — Anesthesia Procedure Notes (Signed)
Procedure Name: Intubation Date/Time: 10/16/2017 12:46 PM Performed by: Steen Bisig D, CRNA Pre-anesthesia Checklist: Patient identified, Emergency Drugs available, Suction available and Patient being monitored Patient Re-evaluated:Patient Re-evaluated prior to induction Oxygen Delivery Method: Circle system utilized Preoxygenation: Pre-oxygenation with 100% oxygen Induction Type: IV induction Ventilation: Mask ventilation without difficulty Grade View: Grade I Tube type: Oral Tube size: 7.5 mm Number of attempts: 1 Airway Equipment and Method: Stylet Placement Confirmation: ETT inserted through vocal cords under direct vision,  positive ETCO2 and breath sounds checked- equal and bilateral Secured at: 22 cm Tube secured with: Tape Dental Injury: Teeth and Oropharynx as per pre-operative assessment

## 2017-10-16 NOTE — Brief Op Note (Signed)
10/16/2017  3:16 PM  PATIENT:  Marlou Starks Wichmann  66 y.o. male  PRE-OPERATIVE DIAGNOSIS:  LARGE RIGHT RENAL AND URETERAL STONE  POST-OPERATIVE DIAGNOSIS:  LARGE LEFT RENAL AND URETERAL STONE  PROCEDURE:  Procedure(s): NEPHROLITHOTOMY PERCUTANEOUS WITH SURGEON ACCESS (Left) CYSTOSCOPY WITH RETROGRADE PYELOGRAM (Left) HOLMIUM LASER APPLICATION (Left)  SURGEON:  Surgeon(s) and Role:    Alexis Frock, MD - Primary  PHYSICIAN ASSISTANT:   ASSISTANTS: Hazle Quant MD   ANESTHESIA:   general  EBL:  74mL   BLOOD ADMINISTERED:none  DRAINS: foley to gravity   LOCAL MEDICATIONS USED:  NONE  SPECIMEN:  Source of Specimen:  Left renal / ureteral stone fragments.   DISPOSITION OF SPECIMEN:  [facility] for compositional analysis  COUNTS:  YES  TOURNIQUET:  * No tourniquets in log *  DICTATION: .Other Dictation: Dictation Number [id number]  PLAN OF CARE: Admit to inpatient   PATIENT DISPOSITION:  PACU - hemodynamically stable.   Delay start of Pharmacological VTE agent (>24hrs) due to surgical blood loss or risk of bleeding: yes

## 2017-10-16 NOTE — H&P (Signed)
Trevor Ayala is an 66 y.o. male.    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 Most recent UCX negative, Cr 1.8, Hgb 15.    Today "Trevor Ayala is seen to proceed with LEFT PCNL / antegrade ureteroscopy for large left lower pole and proximal ureteral stone. No interval fevers.   Past Medical History:  Diagnosis Date  . Asymptomatic PVCs   . History of kidney stones   . Hypertension   . 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 Pre-diabetes    was dx when at over 200lbs ; reports no meds or CBG checks since losing weight   . Prediabetes    diet controlled  . Renal calculus, left   . Right ureteral stone   . Wears glasses     Past Surgical History:  Procedure Laterality Date  . TRANSTHORACIC ECHOCARDIOGRAM  09-09-2014   mild LVH/  ef 628-76%/ grade I diastolic dysfunction/  mild MR/  mild sclerotic Av    Family History  Problem Relation Age of Onset  . Cancer Mother   . Cancer Brother    Social History:  reports that  has never smoked. he has never used smokeless tobacco. He reports that he does not drink alcohol or use drugs.  Allergies: No Known Allergies  No medications prior to admission.    Results for orders placed or performed during the hospital encounter of 10/14/17 (from the past 48 hour(s))  CBC     Status: None   Collection Time: 10/14/17 11:54 AM  Result Value Ref Range   WBC 7.3 4.0 - 10.5 K/uL   RBC 5.01 4.22 - 5.81 MIL/uL   Hemoglobin 15.1 13.0 - 17.0 g/dL   HCT 42.6 39.0 - 52.0 %   MCV 85.0 78.0 - 100.0 fL   MCH 30.1 26.0 - 34.0 pg   MCHC 35.4 30.0 - 36.0 g/dL   RDW 13.3 11.5 - 15.5 %   Platelets 181 150 - 400 K/uL  Basic metabolic panel     Status: Abnormal   Collection Time: 10/14/17 11:54 AM  Result Value Ref Range   Sodium 140 135 - 145  mmol/L   Potassium 4.0 3.5 - 5.1 mmol/L   Chloride 108 101 - 111 mmol/L   CO2 21 (L) 22 - 32 mmol/L   Glucose, Bld 186 (H) 65 - 99 mg/dL   BUN 19 6 - 20 mg/dL   Creatinine, Ser 1.81 (H) 0.61 - 1.24 mg/dL   Calcium 9.4 8.9 - 10.3 mg/dL   GFR calc non Af Amer 37 (L) >60 mL/min   GFR calc Af Amer 43 (L) >60 mL/min    Comment: (NOTE) The eGFR has been calculated using the CKD EPI equation. This calculation has not been validated in all clinical situations. eGFR's persistently <60 mL/min signify possible Chronic Kidney Disease.    Anion gap 11 5 - 15  Hemoglobin A1c     Status: Abnormal   Collection Time: 10/14/17 11:54 AM  Result Value Ref Range   Hgb A1c MFr Bld 6.9 (H) 4.8 - 5.6 %    Comment: (NOTE) Pre diabetes:          5.7%-6.4% Diabetes:              >  6.4% Glycemic control for   <7.0% adults with diabetes    Mean Plasma Glucose 151.33 mg/dL    Comment: Performed at [facility] [address], [place], [place] [address]   No results found.  Review of Systems  Constitutional: Negative.  Negative for chills and fever.  HENT: Negative.   Eyes: Negative.   Respiratory: Negative.   Cardiovascular: Negative.   Gastrointestinal: Negative.   Genitourinary: Negative.   Musculoskeletal: Negative.   Skin: Negative.   Neurological: Negative.   Endo/Heme/Allergies: Negative.   Psychiatric/Behavioral: Negative.     There were no vitals taken for this visit. Physical Exam  Constitutional: He appears well-developed.  HENT:  Head: Normocephalic.  Eyes: Pupils are equal, round, and reactive to light.  Neck: Normal range of motion.  Cardiovascular: Normal rate.  Respiratory: Effort normal.  GI: Soft.  Genitourinary:  Genitourinary Comments: No CVAT at present.   Musculoskeletal: Normal range of motion.  Neurological: He is alert.  Skin: Skin is warm.  Psychiatric: He has a normal mood and affect.     Assessment/Plan  1 - LEFT Partial Staghorn Kidney Stone +  Large Ureteral Stone  - proceed as planned with cysto, left retroagrade / stent / left PCNL with access and antegrade ureteroscopy. Risks, benefits, alternatives, expected peri-op course, need for possible staged approach discussed previously and reiterated today.   Alexis Frock, MD 10/16/2017, 7:52 AM

## 2017-10-16 NOTE — Anesthesia Postprocedure Evaluation (Signed)
Anesthesia Post Note  Patient: Trevor Ayala  Procedure(s) Performed: NEPHROLITHOTOMY PERCUTANEOUS WITH SURGEON ACCESS (Left ) CYSTOSCOPY WITH URETEROSCOPY AND  RETROGRADE PYELOGRAM (Left ) HOLMIUM LASER APPLICATION (Left )     Patient location during evaluation: PACU Anesthesia Type: General Level of consciousness: awake and alert Pain management: pain level controlled Vital Signs Assessment: post-procedure vital signs reviewed and stable Respiratory status: spontaneous breathing, nonlabored ventilation, respiratory function stable and patient connected to nasal cannula oxygen Cardiovascular status: blood pressure returned to baseline and stable Postop Assessment: no apparent nausea or vomiting Anesthetic complications: no    Last Vitals:  Vitals:   10/16/17 1615 10/16/17 1630  BP: 127/72 124/75  Pulse: 84 75  Resp: 14 12  Temp:  36.7 C  SpO2: 97% 98%    Last Pain:  Vitals:   10/16/17 1630  TempSrc:   PainSc: Asleep                 Davyn Morandi S

## 2017-10-16 NOTE — Discharge Instructions (Signed)
1 - You may have urinary urgency (bladder spasms) and bloody urine on / off with stent in place. This is normal.  2 - Flank stitches are absorbable. Some small discharge from this area for few days is expected. No lifting >20lbs x 2 weeks.   3 - Call MD or go to ER for fever >102, severe pain / nausea / vomiting not relieved by medications, or acute change in medical status

## 2017-10-16 NOTE — Transfer of Care (Signed)
Immediate Anesthesia Transfer of Care Note  Patient: Trevor Ayala  Procedure(s) Performed: NEPHROLITHOTOMY PERCUTANEOUS WITH SURGEON ACCESS (Left ) CYSTOSCOPY WITH RETROGRADE PYELOGRAM (Left ) HOLMIUM LASER APPLICATION (Left )  Patient Location: PACU  Anesthesia Type:General  Level of Consciousness: awake, alert  and oriented  Airway & Oxygen Therapy: Patient Spontanous Breathing and Patient connected to face mask oxygen  Post-op Assessment: Report given to RN and Post -op Vital signs reviewed and stable  Post vital signs: Reviewed and stable  Last Vitals:  Vitals:   10/16/17 1117  BP: (!) 142/84  Pulse: 83  Resp: 18  Temp: 36.9 C  SpO2: 99%    Last Pain:  Vitals:   10/16/17 1117  TempSrc: Oral      Patients Stated Pain Goal: 4 (94/94/47 3958)  Complications: No apparent anesthesia complications

## 2017-10-16 NOTE — Anesthesia Preprocedure Evaluation (Signed)
Anesthesia Evaluation  Patient identified by MRN, date of birth, ID band Patient awake    Reviewed: Allergy & Precautions, NPO status , Patient's Chart, lab work & pertinent test results  Airway Mallampati: II  TM Distance: >3 FB Neck ROM: Full    Dental no notable dental hx.    Pulmonary sleep apnea ,    Pulmonary exam normal breath sounds clear to auscultation       Cardiovascular hypertension, Normal cardiovascular exam Rhythm:Regular Rate:Normal     Neuro/Psych negative neurological ROS  negative psych ROS   GI/Hepatic negative GI ROS, Neg liver ROS,   Endo/Other  negative endocrine ROS  Renal/GU negative Renal ROS  negative genitourinary   Musculoskeletal negative musculoskeletal ROS (+)   Abdominal   Peds negative pediatric ROS (+)  Hematology negative hematology ROS (+)   Anesthesia Other Findings   Reproductive/Obstetrics negative OB ROS                             Anesthesia Physical Anesthesia Plan  ASA: II  Anesthesia Plan: General   Post-op Pain Management:    Induction: Intravenous  PONV Risk Score and Plan: 2 and Ondansetron, Dexamethasone and Treatment may vary due to age or medical condition  Airway Management Planned: Oral ETT  Additional Equipment:   Intra-op Plan:   Post-operative Plan: Extubation in OR  Informed Consent: I have reviewed the patients History and Physical, chart, labs and discussed the procedure including the risks, benefits and alternatives for the proposed anesthesia with the patient or authorized representative who has indicated his/her understanding and acceptance.   Dental advisory given  Plan Discussed with: CRNA and Surgeon  Anesthesia Plan Comments:        Anesthesia Quick Evaluation

## 2017-10-17 ENCOUNTER — Encounter (HOSPITAL_COMMUNITY): Payer: Self-pay | Admitting: Urology

## 2017-10-17 DIAGNOSIS — G4730 Sleep apnea, unspecified: Secondary | ICD-10-CM | POA: Diagnosis not present

## 2017-10-17 DIAGNOSIS — N132 Hydronephrosis with renal and ureteral calculous obstruction: Secondary | ICD-10-CM | POA: Diagnosis not present

## 2017-10-17 DIAGNOSIS — I10 Essential (primary) hypertension: Secondary | ICD-10-CM | POA: Diagnosis not present

## 2017-10-17 DIAGNOSIS — Z87442 Personal history of urinary calculi: Secondary | ICD-10-CM | POA: Diagnosis not present

## 2017-10-17 DIAGNOSIS — G4733 Obstructive sleep apnea (adult) (pediatric): Secondary | ICD-10-CM | POA: Diagnosis not present

## 2017-10-17 DIAGNOSIS — Z79899 Other long term (current) drug therapy: Secondary | ICD-10-CM | POA: Diagnosis not present

## 2017-10-17 LAB — HEMOGLOBIN AND HEMATOCRIT, BLOOD
HCT: 40 % (ref 39.0–52.0)
HEMOGLOBIN: 13.6 g/dL (ref 13.0–17.0)

## 2017-10-17 LAB — BASIC METABOLIC PANEL
Anion gap: 9 (ref 5–15)
BUN: 28 mg/dL — AB (ref 6–20)
CO2: 22 mmol/L (ref 22–32)
CREATININE: 2.08 mg/dL — AB (ref 0.61–1.24)
Calcium: 9 mg/dL (ref 8.9–10.3)
Chloride: 108 mmol/L (ref 101–111)
GFR calc Af Amer: 37 mL/min — ABNORMAL LOW (ref >60)
GFR, EST NON AFRICAN AMERICAN: 32 mL/min — AB (ref >60)
Glucose, Bld: 353 mg/dL — ABNORMAL HIGH (ref 65–99)
Potassium: 4.6 mmol/L (ref 3.5–5.1)
SODIUM: 139 mmol/L (ref 135–145)

## 2017-10-17 MED ORDER — OXYCODONE HCL 5 MG PO CAPS: 5.0000 mg | ORAL_CAPSULE | ORAL | 0 refills | Status: DC | PRN

## 2017-10-17 NOTE — Discharge Summary (Signed)
[facility] Discharge Summary  Admit date: 10/16/2017  Discharge date and time: 10/17/17   Discharge to: Home  Discharge Service: Urology  Discharge Attending Physician:  [facility]   Discharge  Diagnoses: Left nephrolithiasis  Secondary Diagnosis: Active Problems:   Nephrolithiasis   OR Procedures: Procedure(s): NEPHROLITHOTOMY PERCUTANEOUS WITH SURGEON ACCESS CYSTOSCOPY WITH URETEROSCOPY AND  RETROGRADE PYELOGRAM HOLMIUM LASER APPLICATION 36/03/6802   Ancillary Procedures: None   Discharge Day Services: The patient was seen and examined by the Urology team both in the morning and immediately prior to discharge.  Vital signs and laboratory values were stable and within normal limits.  The physical exam was benign and unchanged and all surgical wounds were examined.  Discharge instructions were explained and all questions answered.  Subjective  No acute events overnight. Pain Controlled. No fever or chills.  Objective Patient Vitals for the past 8 hrs:  BP Temp Temp src Pulse Resp SpO2  10/17/17 0425 109/74 98.1 F (36.7 C) Oral 64 16 100 %   No intake/output data recorded.  General Appearance:        No acute distress Lungs:                       Normal work of breathing on room air Heart:                                Regular rate and rhythm Abdomen:                         Soft, non-tender, non-distended, PCNL site with dressing intact, no drainage. Urine clear  Extremities:                      Warm and well perfused   Hospital Course:  The patient underwent L CPNL on 10/16/2017.  The patient tolerated the procedure well, was extubated in the OR, and afterwards was taken to the PACU for routine post-surgical care. When stable the patient was transferred to the floor.   The patient did well postoperatively.  The patient's diet was slowly advanced and at the time of discharge was tolerating a regular diet.    Catheter removed POD 1, passed TOV. PCNL tract closed  intraop, no oozing or bleeding post op.   The patient was discharged home 1 Day Post-Op, at which point was tolerating a regular solid diet, was able to void spontaneously, have adequate pain control with P.O. pain medication, and could ambulate without difficulty. The patient will follow up with [place] for post op check.   Condition at Discharge: Improved  Discharge Medications:  Allergies as of 10/17/2017   No Known Allergies     Medication List    STOP taking these medications   oxyCODONE-acetaminophen 10-325 MG tablet Commonly known as:  PERCOCET   oxyCODONE-acetaminophen 5-325 MG tablet Commonly known as:  PERCOCET/ROXICET     TAKE these medications   amLODipine 10 MG tablet Commonly known as:  NORVASC Take 10 mg by mouth daily.   lisinopril 20 MG tablet Commonly known as:  PRINIVIL,ZESTRIL Take 1 tablet (20 mg total) by mouth 2 (two) times daily.   oxycodone 5 MG capsule Commonly known as:  OXY-IR Take 1-2 capsules (5-10 mg total) every 4 (four) hours as needed by mouth.

## 2017-10-17 NOTE — Op Note (Signed)
NAME:  Trevor Ayala, Trevor Ayala                    ACCOUNT NO.:  MEDICAL RECORD NO.:  [id number]  LOCATION:                                 FACILITY:  PHYSICIAN:  Alexis Frock, MD          DATE OF BIRTH:  DATE OF PROCEDURE: 10/16/2017                                OPERATIVE REPORT   DIAGNOSIS:  Large left renal and ureteral stones.  PROCEDURES: 1. Cystoscopy with left retrograde pyelogram and interpretation. 2. Left ureteroscopy with laser lithotripsy. 3. Left percutaneous nephrostolithotomy stone greater than 2 cm.  ESTIMATED BLOOD LOSS:  50 mL.  COMPLICATION:  None.  SPECIMEN:  Left renal and ureteral stone fragments for compositional analysis.  FINDINGS: 1. Very large lower pole partial staghorn kidney stone. 2. Possible parenchymal versus acutely angled lower pole calyx stone. 3. Very large left proximal ureteral stone. 4. Complete resolution of all stone fragments larger than 1/3rd mm     within the left ureter and accessible portions of the kidney. 5. Residual radiographic appearance of possibly parenchymal versus     acutely angled lower pole stone by final spot images. 6. Successful placement of left ureteral stent, proximal in the renal     pelvis and distal in the urinary bladder.  INDICATION:  Trevor Ayala is a pleasant 66 year old gentleman with history of recurrent nephrolithiasis.  He was found on workup of colicky flank pain and renal insufficiency to have a very large left proximal ureteral stone as well as left lower pole partial staghorn stone. Options were discussed for management including staged ureteroscopy versus percutaneous approach surgery, and he wished to proceed with percutaneous nephrostolithotomy with goal of left-sided stone free. Informed consent was obtained and placed in the medical record.  PROCEDURE IN DETAIL:  The patient being [name], was verified. Procedure being left percutaneous nephrostolithotomy was confirmed. Procedure was  carried out.  Time-out was performed.  Intravenous antibiotics were administered.  General endotracheal anesthesia was introduced.  The patient was placed into a low lithotomy position, and sterile field was created by prepping and draping the patient's penis, perineum and proximal thighs using iodine.  Next, cystourethroscopy was performed using a 22-French rigid cystoscope with offset lens. Inspection of the anterior and posterior urethra was unremarkable. Inspection of the urinary bladder revealed no diverticula, calcifications, or papular lesions.  The left ureteral orifice was cannulated with a 6-French end-hole catheter and left retrograde pyelogram was obtained.  Left retrograde pyelogram demonstrated a single left ureter with single- system left kidney.  There was a large calcification during filling defect in the proximal ureter consistent with known stone.  There was very large hydroureteronephrosis down to this level.  A 0.038 Zip wire was advanced to the level of the upper ureter, but would not advance proximal to the stone signifying likely significant impaction.  An open- ended catheter was brought just below this level and multiple angulations of this wire as well as Sensor wire were attempted, and through-and-through access was not able to obtain to the level of the upper pole; however, there was retrograde filling of the hydronephrotic kidney.  As this was essentially the goal for  this portion of the procedure, the open-ended catheter was left just below the area of proximal stone.  Foley catheter was placed per urethra to straight drain and the open-ended catheter fashioned to this with piece of extension tubing.  The patient was then repositioned in prone position employing 15 degrees of table flexion to maximize the space between 12th rib and iliac crest.  Prone view, sequential compression devices padding all bony prominences.  A new sterile field was created by  prepping and draping the patient's entire left flank using chlorhexidine gluconate. Using simultaneous retrograde filling via the open-ended catheter and spot fluoroscopic images, 15 degrees off center and Valsalva, a suitable lower pole calyx in the lower mid area was identified, which appeared to have good angulation to lower pole stone as well as the area of the UPJ, and using bull's eye technique, this was cannulated with an 18-gauge needle.  A 0.038 Zip wire was then advanced to the level of renal pelvis and coiled.  A KMP-type catheter was then advanced.  Multiple angulations were used to try to attempt to gain through-and-through ureteral access from above, but again the proximal stone was so impacted, this was not possible.  As such, a Super-Stiff wire was coiled in the renal pelvis and a coaxial introducer was introduced at the level of the renal pelvis and a second Amplatz wire was advanced with 2-wire access across the lower pole calyx.  Incision was made approximately 1 cm, the fascia dilated under fluoroscopic vision and the NephroMax balloon dilation apparatus was advanced across the calyx, inflated to pressure of 20 atmospheres and the sheath advanced across the calyx using fluoroscopic guidance.  Next, rigid nephroscopy was performed, this revealed excellent placement of the sheath, no evidence of renal perforation and accessible portion of approximately 70% of the renal stone burden radiographically, this was amenable to rigid nephroscopic extraction.  Next, using flexible cystoscope, antegrade ureteroscopy was performed down to the level of the ureteral stone, this was quite large and impacted as anticipated.  This appeared to be much too large for simple basketing.  As such, holmium laser energy was applied to the stone using setting of 0.3 joules and 30 Hz, and approximately 60% of the stone volume was ablated, the remaining 40% fragmented into pieces approximately 4  mm or so, which were then grasped with an Escape basket, removed and set aside for compositional analysis.  The area of prior stone impaction was quite inflamed as anticipated, but there was no evidence of obvious ureteral perforation.  Having gained visible access across the ureter, a Sensor wire was advanced from above down to the level of the bladder.  The flexible cystoscope was then used in antegrade fashion to further investigate all accessible calices of left kidney.  Radiographically, that did appear to be an area of stone and an extreme lower pole calyx.  However, this was not able to be visualized. This felt to likely represent an acutely angled calyx that was not accessible via antegrade technique versus parenchymal stone that was not truly intraluminal, either which, was felt to be favorable and low risk. Next, antegrade ureteroscopy was performed using flexible digital ureteroscope and the entire aspect of the left ureter was inspected down to the level of the ureterovesical junction.  There were no visible calcifications noted.  As we achieved the goals of procedure today, it was felt that nephrostomy tube would not be warranted and just antegrade stenting alone.  As such, a 6 x 26  Contour-type stent was placed using nephroscopic and fluoroscopic guidance.  Good proximal and distal deployment were noted.  Next, 10 mL of FloSeal was applied radiographically along the sheath tract.  The sheath was removed and the skin closed with interrupted Vicryl followed by dry dressing.  Procedure was then terminated.  The patient tolerated the procedure well.  There were no immediate periprocedural complications.  The patient was taken to the Postanesthesia Care Unit in stable condition.          ______________________________ Alexis Frock, MD     TM/MEDQ  D:  10/16/2017  T:  10/17/2017  Job:  [id number]

## 2017-10-17 NOTE — Care Management Note (Signed)
Case Management Note  Patient Details  Name: Trevor Ayala MRN: [id number] Date of Birth: [birth year]  Subjective/Objective:     Pt admitted for Nephrolithiasis               Action/Plan: Plan to discharge home with no needs.   Expected Discharge Date:  10/17/17               Expected Discharge Plan:  Home/Self Care  In-House Referral:     Discharge planning Services  CM Consult  Post Acute Care Choice:    Choice offered to:     DME Arranged:    DME Agency:     HH Arranged:    HH Agency:     Status of Service:  Completed, signed off  If discussed at [employer] of Stay Meetings, dates discussed:    Additional CommentsPurcell Mouton, RN 10/17/2017, 2:08 PM

## 2017-10-17 NOTE — Progress Notes (Signed)
Pt discharged to home, instructions reviewed, waiting for wife to pick up at 130 pm. Pt instable condition, voiding without issues. SRP,RN

## 2017-10-28 DIAGNOSIS — I10 Essential (primary) hypertension: Secondary | ICD-10-CM | POA: Diagnosis not present

## 2017-10-28 DIAGNOSIS — G894 Chronic pain syndrome: Secondary | ICD-10-CM | POA: Diagnosis not present

## 2017-10-28 DIAGNOSIS — Z1389 Encounter for screening for other disorder: Secondary | ICD-10-CM | POA: Diagnosis not present

## 2017-10-28 DIAGNOSIS — Z6827 Body mass index (BMI) 27.0-27.9, adult: Secondary | ICD-10-CM | POA: Diagnosis not present

## 2017-10-28 DIAGNOSIS — E782 Mixed hyperlipidemia: Secondary | ICD-10-CM | POA: Diagnosis not present

## 2017-10-28 DIAGNOSIS — G4733 Obstructive sleep apnea (adult) (pediatric): Secondary | ICD-10-CM | POA: Diagnosis not present

## 2017-11-04 DIAGNOSIS — N189 Chronic kidney disease, unspecified: Secondary | ICD-10-CM | POA: Diagnosis not present

## 2017-11-04 DIAGNOSIS — N200 Calculus of kidney: Secondary | ICD-10-CM | POA: Diagnosis not present

## 2017-11-04 DIAGNOSIS — N132 Hydronephrosis with renal and ureteral calculous obstruction: Secondary | ICD-10-CM | POA: Diagnosis not present

## 2017-12-08 NOTE — Progress Notes (Signed)
Cardiology Office Note    Date:  12/09/2017   ID:  Trevor Ayala, DOB February 17, 1951, MRN [id number]  PCP:  Redmond School, MD  Cardiologist: Dr. Harl Bowie  Chief Complaint  Patient presents with  . Follow-up    Annual Visit    History of Present Illness:    Trevor Ayala is a 66 y.o. male with past medical history of palpitations (PVC's by prior monitor), HTN, and prediabetes who presets to the office today for annual follow-up.   He was last examined by Jory Sims, NP in 09/2016 and reported doing well from a cardiac perspective at that time. He was continued on his current medication regimen of Lisinopril 20m BID. [facility] and tolerated the procedure well without any noted complications.   In talking with the patient today, he reports overall doing well since his last office visit. He denies any recent chest discomfort, dyspnea on exertion, palpitations, orthopnea, PND, or lower extremity edema. No recent lightheadedness, dizziness, or presyncope.  He does not exercise regularly but reports being active at baseline and performing yard work on a daily basis along with raising his 919-monthld grandchild. 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   His PCP stopped his Lisinopril and switched him to Amlodipine 10 mg daily several months ago. He does not check his blood pressure regularly but it is well controlled at 114/84 during today's visit.   Past Medical History:  Diagnosis Date  . Asymptomatic PVCs   . History of kidney stones   . Hypertension   . 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 Pre-diabetes    was dx when at over 200lbs ; reports no meds or CBG checks since losing weight   . PVC (premature ventricular contraction)    a. 2015: echo showing EF of 60-65%, no WMA, Grade 1 DD, and mild MR. NST low-risk.   . Renal calculus, left   .  Right ureteral stone   . Wears glasses     Past Surgical History:  Procedure Laterality Date  . CYSTOSCOPY W/ RETROGRADES Left 10/16/2017   Procedure: CYSTOSCOPY WITH URETEROSCOPY AND  RETROGRADE PYELOGRAM;  Surgeon: MaAlexis FrockMD;  [facility];  Service: Urology;  Laterality: Left;  . CYSTOSCOPY WITH RETROGRADE PYELOGRAM, URETEROSCOPY AND STENT PLACEMENT Right 10/06/2014   Procedure: CYSTOSCOPY WITH RETROGRADE PYELOGRAM, URETEROSCOPY AND STENT PLACEMENT;  Surgeon: ThAlexis FrockMD;  [facility];  Laterality: Right;  . HOLMIUM LASER APPLICATION Right 1078/29/5621 Procedure: HOLMIUM LASER APPLICATION;  Surgeon: ThAlexis FrockMD;  [facility];  Laterality: Right;  . HOLMIUM LASER APPLICATION Left 1130/07/6577 Procedure: HOLMIUM LASER APPLICATION;  Surgeon: MaAlexis FrockMD;  [facility];  Service: Urology;  Laterality: Left;  . NEPHROLITHOTOMY Left 10/16/2017   Procedure: NEPHROLITHOTOMY PERCUTANEOUS WITH SURGEON ACCESS;  Surgeon: MaAlexis FrockMD;  [facility];  Service: Urology;  Laterality: Left;  . TRANSTHORACIC ECHOCARDIOGRAM  09-09-2014   mild LVH/  ef 6046-96%/grade I diastolic dysfunction/  mild MR/  mild sclerotic Av    Current Medications: Outpatient Medications Prior to Visit  Medication Sig Dispense Refill  . amLODipine (NORVASC) 10 MG tablet Take 10 mg by mouth daily.    . Marland Kitchenxycodone (OXY-IR) 5 MG capsule Take 1-2 capsules (5-10 mg  total) every 4 (four) hours as needed by mouth. 8 capsule 0  . lisinopril (PRINIVIL,ZESTRIL) 20 MG tablet Take 1 tablet (20 mg total) by mouth 2 (two) times daily. (Patient not taking: Reported on 10/08/2017) 180 tablet 3   No facility-administered medications prior to visit.      Allergies:   Patient has no known allergies.   Social History   Socioeconomic History  . Marital status: Married    Spouse name: None  . Number of  children: None  . Years of education: None  . Highest education level: None  Social Needs  . Financial resource strain: None  . Food insecurity - worry: None  . Food insecurity - inability: None  . Transportation needs - medical: None  . Transportation needs - non-medical: None  Occupational History  . None  Tobacco Use  . Smoking status: Never Smoker  . Smokeless tobacco: Never Used  Substance and Sexual Activity  . Alcohol use: No    Alcohol/week: 0.0 oz    Frequency: Never  . Drug use: No  . Sexual activity: None  Other Topics Concern  . None  Social History Narrative  . None     Family History:  The patient's family history includes Cancer in his brother and mother.   Review of Systems:   Please see the history of present illness.     General:  No chills, fever, night sweats or weight changes.  Cardiovascular:  No chest pain, dyspnea on exertion, edema, orthopnea, palpitations, paroxysmal nocturnal dyspnea. Dermatological: No rash, lesions/masses Respiratory: No cough, dyspnea Urologic: No hematuria, dysuria Abdominal:   No nausea, vomiting, diarrhea, bright red blood per rectum, melena, or hematemesis Neurologic:  No visual changes, wkns, changes in mental status.  He denies any of the above symptoms.   All other systems reviewed and are otherwise negative except as noted above.   Physical Exam:    VS:  BP 114/84   Pulse 82   Ht 5' 8"  (1.727 m)   Wt 188 lb (85.3 kg)   BMI 28.59 kg/m    General: Well developed, well nourished Caucasian male appearing in no acute distress. Head: Normocephalic, atraumatic, sclera non-icteric, no xanthomas, nares are without discharge.  Neck: No carotid bruits. JVD not elevated.  Lungs: Respirations regular and unlabored, without wheezes or rales.  Heart: Regular rate and rhythm. No S3 or S4.  No murmur, no rubs, or gallops appreciated. Abdomen: Soft, non-tender, non-distended with normoactive bowel sounds. No hepatomegaly.  No rebound/guarding. No obvious abdominal masses. Msk:  Strength and tone appear normal for age. No joint deformities or effusions. Extremities: No clubbing or cyanosis. No lower extremity edema.  Distal pedal pulses are 2+ bilaterally. Neuro: Alert and oriented X 3. Moves all extremities spontaneously. No focal deficits noted. Psych:  Responds to questions appropriately with a normal affect. Skin: No rashes or lesions noted  Wt Readings from Last 3 Encounters:  12/09/17 188 lb (85.3 kg)  10/16/17 188 lb (85.3 kg)  10/14/17 188 lb 3.2 oz (85.4 kg)     Studies/Labs Reviewed:   EKG:  EKG is ordered today.  The ekg ordered today demonstrates NSR, HR 82, with no acute ST or T-wave changes.   Recent Labs: 10/14/2017: Platelets 181 10/17/2017: BUN 28; Creatinine, Ser 2.08; Hemoglobin 13.6; Potassium 4.6; Sodium 139   Lipid Panel No results found for: CHOL, TRIG, HDL, CHOLHDL, VLDL, LDLCALC, LDLDIRECT  Additional studies/ records that were reviewed today include:   Echocardiogram: 09/2014 Study  Conclusions  - Left ventricle: The cavity size was normal. Wall thickness was increased in a pattern of mild LVH. Systolic function was normal. The estimated ejection fraction was in the range of 60% to 65%. Wall motion was normal; there were no regional wall motion abnormalities. Doppler parameters are consistent with abnormal left ventricular relaxation (grade 1 diastolic dysfunction). Doppler parameters are consistent with elevated ventricular end-diastolic filling pressure. - Aortic valve: Mildly calcified annulus. Trileaflet; mildly calcified leaflets. - Mitral valve: Calcified annulus. There was mild regurgitation. - Right atrium: Central venous pressure (est): 3 mm Hg. - Tricuspid valve: There was trivial regurgitation. - Pulmonary arteries: Systolic pressure could not be accurately estimated. - Pericardium, extracardiac: There was no pericardial  effusion.  Impressions:  - Mild LVH with LVEF 38-18%, grade 1 diastolic dysfunction with increased filling pressures. MAC with mild mitral regurgitation. Mildly sclerotic aortic valve. Trivial tricuspid regurgitation, unable to assess PASP.  NST: 10/2014 IMPRESSION: 1. No reversible ischemia or infarction.  2. Normal left ventricular wall motion.  3. Left ventricular ejection fraction 64%  4. Low-risk stress test findings*.   Assessment:    1. PVC's (premature ventricular contractions)   2. Essential hypertension      Plan:   In order of problems listed above:  1. PVC's - noted on prior EKG's. Echocardiogram and stress testing in 2015 showed no significant findings with his stress test being low-risk.  - he denies any recent chest pain, dyspnea on exertion, palpitations, lightheadedness, dizziness, or presyncope.  - EKG today shows NSR with no acute changes.  - no indication for changes in therapy or further ischemic evaluation at this time.   2. HTN - BP is well-controlled at 114/84 during today's visit. - continue Amlodipine 80m daily.   Medication Adjustments/Labs and Tests Ordered: Current medicines are reviewed at length with the patient today.  Concerns regarding medicines are outlined above.  Medication changes, Labs and Tests ordered today are listed in the Patient Instructions below. Patient Instructions  Medication Instructions:  Your physician recommends that you continue on your current medications as directed. Please refer to the Current Medication list given to you today.   Labwork: NONE   Testing/Procedures: NONE   Follow-Up: Your physician wants you to follow-up in: 1 Year with Dr. BHarl Bowie  You will receive a reminder letter in the mail two months in advance. If you don't receive a letter, please call our office to schedule the follow-up appointment.   Any Other Special Instructions Will Be Listed Below (If Applicable).  No  Further Cardiac Testing at This Time.    If you need a refill on your cardiac medications before your next appointment, please call your pharmacy. Thank you for choosing [facility]    Signed, BErma Heritage PA-C  12/09/2017 2:20 PM    [facility] [address] [place]  [address]Phone: [phone] Fax: [phone] [address] [place] [address]Phone: [phone]

## 2017-12-09 ENCOUNTER — Encounter: Payer: Self-pay | Admitting: Student

## 2017-12-09 ENCOUNTER — Ambulatory Visit: Payer: Medicare HMO | Admitting: Student

## 2017-12-09 VITALS — BP 114/84 | HR 82 | Ht 68.0 in | Wt 188.0 lb

## 2017-12-09 DIAGNOSIS — I493 Ventricular premature depolarization: Secondary | ICD-10-CM | POA: Diagnosis not present

## 2017-12-09 DIAGNOSIS — I10 Essential (primary) hypertension: Secondary | ICD-10-CM | POA: Diagnosis not present

## 2017-12-09 NOTE — Patient Instructions (Signed)
Medication Instructions:  Your physician recommends that you continue on your current medications as directed. Please refer to the Current Medication list given to you today.   Labwork: NONE   Testing/Procedures: NONE   Follow-Up: Your physician wants you to follow-up in: 1 Year with Dr. Harl Bowie.  You will receive a reminder letter in the mail two months in advance. If you don't receive a letter, please call our office to schedule the follow-up appointment.   Any Other Special Instructions Will Be Listed Below (If Applicable).  No Further Cardiac Testing at This Time.    If you need a refill on your cardiac medications before your next appointment, please call your pharmacy. Thank you for choosing [facility]!

## 2018-02-20 DIAGNOSIS — G894 Chronic pain syndrome: Secondary | ICD-10-CM | POA: Diagnosis not present

## 2018-02-20 DIAGNOSIS — I10 Essential (primary) hypertension: Secondary | ICD-10-CM | POA: Diagnosis not present

## 2018-02-20 DIAGNOSIS — E119 Type 2 diabetes mellitus without complications: Secondary | ICD-10-CM | POA: Diagnosis not present

## 2018-02-20 DIAGNOSIS — Z1389 Encounter for screening for other disorder: Secondary | ICD-10-CM | POA: Diagnosis not present

## 2018-02-20 DIAGNOSIS — Z6827 Body mass index (BMI) 27.0-27.9, adult: Secondary | ICD-10-CM | POA: Diagnosis not present

## 2018-02-20 DIAGNOSIS — E663 Overweight: Secondary | ICD-10-CM | POA: Diagnosis not present

## 2018-02-20 DIAGNOSIS — J301 Allergic rhinitis due to pollen: Secondary | ICD-10-CM | POA: Diagnosis not present

## 2018-06-11 DIAGNOSIS — E663 Overweight: Secondary | ICD-10-CM | POA: Diagnosis not present

## 2018-06-11 DIAGNOSIS — I10 Essential (primary) hypertension: Secondary | ICD-10-CM | POA: Diagnosis not present

## 2018-06-11 DIAGNOSIS — Z0001 Encounter for general adult medical examination with abnormal findings: Secondary | ICD-10-CM | POA: Diagnosis not present

## 2018-06-11 DIAGNOSIS — Z6827 Body mass index (BMI) 27.0-27.9, adult: Secondary | ICD-10-CM | POA: Diagnosis not present

## 2018-06-11 DIAGNOSIS — N184 Chronic kidney disease, stage 4 (severe): Secondary | ICD-10-CM | POA: Diagnosis not present

## 2018-06-11 DIAGNOSIS — E1129 Type 2 diabetes mellitus with other diabetic kidney complication: Secondary | ICD-10-CM | POA: Diagnosis not present

## 2018-06-11 DIAGNOSIS — G894 Chronic pain syndrome: Secondary | ICD-10-CM | POA: Diagnosis not present

## 2018-06-16 DIAGNOSIS — Z0001 Encounter for general adult medical examination with abnormal findings: Secondary | ICD-10-CM | POA: Diagnosis not present

## 2018-06-16 DIAGNOSIS — Z6827 Body mass index (BMI) 27.0-27.9, adult: Secondary | ICD-10-CM | POA: Diagnosis not present

## 2018-06-16 DIAGNOSIS — Z0000 Encounter for general adult medical examination without abnormal findings: Secondary | ICD-10-CM | POA: Diagnosis not present

## 2018-06-16 DIAGNOSIS — Z125 Encounter for screening for malignant neoplasm of prostate: Secondary | ICD-10-CM | POA: Diagnosis not present

## 2018-06-16 DIAGNOSIS — Z1389 Encounter for screening for other disorder: Secondary | ICD-10-CM | POA: Diagnosis not present

## 2018-06-16 DIAGNOSIS — E663 Overweight: Secondary | ICD-10-CM | POA: Diagnosis not present

## 2018-09-11 DIAGNOSIS — I10 Essential (primary) hypertension: Secondary | ICD-10-CM | POA: Diagnosis not present

## 2018-09-11 DIAGNOSIS — G894 Chronic pain syndrome: Secondary | ICD-10-CM | POA: Diagnosis not present

## 2018-09-11 DIAGNOSIS — E1129 Type 2 diabetes mellitus with other diabetic kidney complication: Secondary | ICD-10-CM | POA: Diagnosis not present

## 2018-09-11 DIAGNOSIS — Z1389 Encounter for screening for other disorder: Secondary | ICD-10-CM | POA: Diagnosis not present

## 2018-09-11 DIAGNOSIS — Z6827 Body mass index (BMI) 27.0-27.9, adult: Secondary | ICD-10-CM | POA: Diagnosis not present

## 2018-09-29 DIAGNOSIS — Z1211 Encounter for screening for malignant neoplasm of colon: Secondary | ICD-10-CM | POA: Diagnosis not present

## 2018-10-28 DIAGNOSIS — N200 Calculus of kidney: Secondary | ICD-10-CM | POA: Diagnosis not present

## 2018-11-25 DIAGNOSIS — E1129 Type 2 diabetes mellitus with other diabetic kidney complication: Secondary | ICD-10-CM | POA: Diagnosis not present

## 2018-11-25 DIAGNOSIS — I10 Essential (primary) hypertension: Secondary | ICD-10-CM | POA: Diagnosis not present

## 2018-11-25 DIAGNOSIS — G894 Chronic pain syndrome: Secondary | ICD-10-CM | POA: Diagnosis not present

## 2018-11-25 DIAGNOSIS — E663 Overweight: Secondary | ICD-10-CM | POA: Diagnosis not present

## 2018-11-25 DIAGNOSIS — Z6828 Body mass index (BMI) 28.0-28.9, adult: Secondary | ICD-10-CM | POA: Diagnosis not present

## 2018-12-20 DIAGNOSIS — E1129 Type 2 diabetes mellitus with other diabetic kidney complication: Secondary | ICD-10-CM | POA: Diagnosis not present

## 2018-12-20 DIAGNOSIS — E119 Type 2 diabetes mellitus without complications: Secondary | ICD-10-CM | POA: Diagnosis not present

## 2018-12-23 DIAGNOSIS — I10 Essential (primary) hypertension: Secondary | ICD-10-CM | POA: Diagnosis not present

## 2018-12-23 DIAGNOSIS — G894 Chronic pain syndrome: Secondary | ICD-10-CM | POA: Diagnosis not present

## 2018-12-23 DIAGNOSIS — R05 Cough: Secondary | ICD-10-CM | POA: Diagnosis not present

## 2018-12-23 DIAGNOSIS — E782 Mixed hyperlipidemia: Secondary | ICD-10-CM | POA: Diagnosis not present

## 2018-12-23 DIAGNOSIS — E119 Type 2 diabetes mellitus without complications: Secondary | ICD-10-CM | POA: Diagnosis not present

## 2018-12-23 DIAGNOSIS — E1129 Type 2 diabetes mellitus with other diabetic kidney complication: Secondary | ICD-10-CM | POA: Diagnosis not present

## 2018-12-23 DIAGNOSIS — Z0001 Encounter for general adult medical examination with abnormal findings: Secondary | ICD-10-CM | POA: Diagnosis not present

## 2018-12-23 DIAGNOSIS — E663 Overweight: Secondary | ICD-10-CM | POA: Diagnosis not present

## 2018-12-23 DIAGNOSIS — R5383 Other fatigue: Secondary | ICD-10-CM | POA: Diagnosis not present

## 2018-12-23 DIAGNOSIS — Z1389 Encounter for screening for other disorder: Secondary | ICD-10-CM | POA: Diagnosis not present

## 2018-12-23 DIAGNOSIS — Z6827 Body mass index (BMI) 27.0-27.9, adult: Secondary | ICD-10-CM | POA: Diagnosis not present

## 2018-12-23 DIAGNOSIS — N184 Chronic kidney disease, stage 4 (severe): Secondary | ICD-10-CM | POA: Diagnosis not present

## 2019-01-07 ENCOUNTER — Ambulatory Visit: Payer: Medicare HMO | Admitting: Cardiology

## 2019-01-07 ENCOUNTER — Encounter: Payer: Self-pay | Admitting: Cardiology

## 2019-01-07 VITALS — BP 136/78 | HR 87 | Ht 68.0 in | Wt 179.0 lb

## 2019-01-07 DIAGNOSIS — I493 Ventricular premature depolarization: Secondary | ICD-10-CM

## 2019-01-07 DIAGNOSIS — I10 Essential (primary) hypertension: Secondary | ICD-10-CM | POA: Diagnosis not present

## 2019-01-07 NOTE — Patient Instructions (Signed)

## 2019-01-07 NOTE — Progress Notes (Signed)
Clinical Summary Trevor Ayala is a 68 y.o.male seen today for follow up of the following medical problems.   1. Irregular heart beat/PVCs - completed holter monitor which showed fairly frequent PVCs (approx 12000), 2 short runs of NSVT. - echo and stress competed without significant abnormalities.    - no recent palpitations - no recent chest pain, no SOB or DOE   2. HTN - compliant with meds    SH: retired 2 years ago. Has custody of 57 year old grandson. Former Administrator Past Medical History:  Diagnosis Date  . Asymptomatic PVCs   . History of kidney stones   . Hypertension   . 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 Pre-diabetes    was dx when at over 200lbs ; reports no meds or CBG checks since losing weight   . PVC (premature ventricular contraction)    a. 2015: echo showing EF of 60-65%, no WMA, Grade 1 DD, and mild MR. NST low-risk.   . Renal calculus, left   . Right ureteral stone   . Wears glasses      No Known Allergies   Current Outpatient Medications  Medication Sig Dispense Refill  . amLODipine (NORVASC) 10 MG tablet Take 10 mg by mouth daily.    Marland Kitchen oxycodone (OXY-IR) 5 MG capsule Take 1-2 capsules (5-10 mg total) every 4 (four) hours as needed by mouth. 8 capsule 0   No current facility-administered medications for this visit.      Past Surgical History:  Procedure Laterality Date  . CYSTOSCOPY W/ RETROGRADES Left 10/16/2017   Procedure: CYSTOSCOPY WITH URETEROSCOPY AND  RETROGRADE PYELOGRAM;  Surgeon: Alexis Frock, MD;  [facility];  Service: Urology;  Laterality: Left;  . CYSTOSCOPY WITH RETROGRADE PYELOGRAM, URETEROSCOPY AND STENT PLACEMENT Right 10/06/2014   Procedure: CYSTOSCOPY WITH RETROGRADE PYELOGRAM, URETEROSCOPY AND STENT PLACEMENT;  Surgeon: Alexis Frock, MD;  Location: [facility];  Service: Urology;  Laterality: Right;  . HOLMIUM LASER APPLICATION Right 94/70/9628    Procedure: HOLMIUM LASER APPLICATION;  Surgeon: Alexis Frock, MD;  Location: [facility];  Service: Urology;  Laterality: Right;  . HOLMIUM LASER APPLICATION Left 36/05/2946   Procedure: HOLMIUM LASER APPLICATION;  Surgeon: Alexis Frock, MD;  [facility]: [facility];  Service: Urology;  Laterality: Left;  . NEPHROLITHOTOMY Left 10/16/2017   Procedure: NEPHROLITHOTOMY PERCUTANEOUS WITH SURGEON ACCESS;  Surgeon: Alexis Frock, MD;  [facility];  Service: Urology;  Laterality: Left;  . TRANSTHORACIC ECHOCARDIOGRAM  09-09-2014   mild LVH/  ef 65-46%/  grade I diastolic dysfunction/  mild MR/  mild sclerotic Av     No Known Allergies    Family History  Problem Relation Age of Onset  . Cancer Mother   . Cancer Brother      Social History Mr. Helsley reports that he has never smoked. He has never used smokeless tobacco. Mr. Cuadra reports no history of alcohol use.   Review of Systems CONSTITUTIONAL: No weight loss, fever, chills, weakness or fatigue.  HEENT: Eyes: No visual loss, blurred vision, double vision or yellow sclerae.No hearing loss, sneezing, congestion, runny nose or sore throat.  SKIN: No rash or itching.  CARDIOVASCULAR: per hpi RESPIRATORY: No shortness of breath, cough or sputum.  GASTROINTESTINAL: No anorexia, nausea, vomiting or diarrhea. No abdominal pain or blood.  GENITOURINARY: No burning on urination, no polyuria NEUROLOGICAL: No headache, dizziness, syncope, paralysis, ataxia,  numbness or tingling in the extremities. No change in bowel or bladder control.  MUSCULOSKELETAL: No muscle, back pain, joint pain or stiffness.  LYMPHATICS: No enlarged nodes. No history of splenectomy.  PSYCHIATRIC: No history of depression or anxiety.  ENDOCRINOLOGIC: No reports of sweating, cold or heat intolerance. No polyuria or polydipsia.  Marland Kitchen   Physical Examination Vitals:   01/07/19 1004  BP: 136/78  Pulse: 87  SpO2: 98%   Vitals:   01/07/19 1004    Weight: 179 lb (81.2 kg)  Height: _0  (1.727 m)    Gen: resting comfortably, no acute distress HEENT: no scleral icterus, pupils equal round and reactive, no palptable cervical adenopathy,  CV: RRR, no mr/g, no jvd Resp: Clear to auscultation bilaterally GI: abdomen is soft, non-tender, non-distended, normal bowel sounds, no hepatosplenomegaly MSK: extremities are warm, no edema.  Skin: warm, no rash Neuro:  no focal deficits Psych: appropriate affect   Diagnostic Studies 09/2014 Echo Study Conclusions  - Left ventricle: The cavity size was normal. Wall thickness was increased in a pattern of mild LVH. Systolic function was normal. The estimated ejection fraction was in the range of 60% to 65%. Wall motion was normal; there were no regional wall motion abnormalities. Doppler parameters are consistent with abnormal left ventricular relaxation (grade 1 diastolic dysfunction). Doppler parameters are consistent with elevated ventricular end-diastolic filling pressure. - Aortic valve: Mildly calcified annulus. Trileaflet; mildly calcified leaflets. - Mitral valve: Calcified annulus. There was mild regurgitation. - Right atrium: Central venous pressure (est): 3 mm Hg. - Tricuspid valve: There was trivial regurgitation. - Pulmonary arteries: Systolic pressure could not be accurately estimated. - Pericardium, extracardiac: There was no pericardial effusion.  Impressions:  - Mild LVH with LVEF 34-74%, grade 1 diastolic dysfunction with increased filling pressures. MAC with mild mitral regurgitation. Mildly sclerotic aortic valve. Trivial tricuspid regurgitation, unable to assess PASP.  10/2014 MPI IMPRESSION: 1. No reversible ischemia or infarction.  2. Normal left ventricular wall motion.  3. Left ventricular ejection fraction 64%  4. Low-risk stress test findings*.  08/2014 Holter Frequent PVCs, 2 runs NSVT 6 and 5 beats    Assessment  and Plan  1. Irregular heart beat/PVCs - several year history, asymptomatic - negative workup for underlying heart disease with echo and stress test - EKG today shows NSR, no ectopy - no further workup at this time  2. HTN - essentially at goal, continue current meds    3. 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 Request labs from pcp  Arnoldo Lenis, M.D.

## 2019-01-30 DIAGNOSIS — Z23 Encounter for immunization: Secondary | ICD-10-CM | POA: Diagnosis not present

## 2019-01-30 DIAGNOSIS — Z6827 Body mass index (BMI) 27.0-27.9, adult: Secondary | ICD-10-CM | POA: Diagnosis not present

## 2019-01-30 DIAGNOSIS — G894 Chronic pain syndrome: Secondary | ICD-10-CM | POA: Diagnosis not present

## 2019-01-30 DIAGNOSIS — I10 Essential (primary) hypertension: Secondary | ICD-10-CM | POA: Diagnosis not present

## 2019-01-30 DIAGNOSIS — E1140 Type 2 diabetes mellitus with diabetic neuropathy, unspecified: Secondary | ICD-10-CM | POA: Diagnosis not present

## 2019-02-20 DIAGNOSIS — R400 Somnolence: Secondary | ICD-10-CM | POA: Diagnosis not present

## 2019-02-25 DIAGNOSIS — G894 Chronic pain syndrome: Secondary | ICD-10-CM | POA: Diagnosis not present

## 2019-02-25 DIAGNOSIS — E663 Overweight: Secondary | ICD-10-CM | POA: Diagnosis not present

## 2019-02-25 DIAGNOSIS — Z6827 Body mass index (BMI) 27.0-27.9, adult: Secondary | ICD-10-CM | POA: Diagnosis not present

## 2019-02-25 DIAGNOSIS — M5136 Other intervertebral disc degeneration, lumbar region: Secondary | ICD-10-CM | POA: Diagnosis not present

## 2019-03-05 DIAGNOSIS — G4730 Sleep apnea, unspecified: Secondary | ICD-10-CM | POA: Diagnosis not present

## 2019-03-16 DIAGNOSIS — G4733 Obstructive sleep apnea (adult) (pediatric): Secondary | ICD-10-CM | POA: Diagnosis not present

## 2019-04-24 DIAGNOSIS — G894 Chronic pain syndrome: Secondary | ICD-10-CM | POA: Diagnosis not present

## 2019-04-24 DIAGNOSIS — Z6827 Body mass index (BMI) 27.0-27.9, adult: Secondary | ICD-10-CM | POA: Diagnosis not present

## 2019-04-24 DIAGNOSIS — E1140 Type 2 diabetes mellitus with diabetic neuropathy, unspecified: Secondary | ICD-10-CM | POA: Diagnosis not present

## 2019-04-24 DIAGNOSIS — L84 Corns and callosities: Secondary | ICD-10-CM | POA: Diagnosis not present

## 2019-04-24 DIAGNOSIS — E1129 Type 2 diabetes mellitus with other diabetic kidney complication: Secondary | ICD-10-CM | POA: Diagnosis not present

## 2019-05-11 ENCOUNTER — Ambulatory Visit: Payer: Medicare HMO | Admitting: Orthopedic Surgery

## 2019-05-11 ENCOUNTER — Encounter: Payer: Self-pay | Admitting: Orthopedic Surgery

## 2019-05-11 ENCOUNTER — Ambulatory Visit (INDEPENDENT_AMBULATORY_CARE_PROVIDER_SITE_OTHER): Payer: Medicare HMO

## 2019-05-11 ENCOUNTER — Other Ambulatory Visit: Payer: Self-pay

## 2019-05-11 VITALS — BP 160/87 | HR 64 | Temp 97.5°F | Ht 68.0 in | Wt 185.0 lb

## 2019-05-11 DIAGNOSIS — M1711 Unilateral primary osteoarthritis, right knee: Secondary | ICD-10-CM

## 2019-05-11 DIAGNOSIS — M25561 Pain in right knee: Secondary | ICD-10-CM | POA: Diagnosis not present

## 2019-05-11 DIAGNOSIS — S83241A Other tear of medial meniscus, current injury, right knee, initial encounter: Secondary | ICD-10-CM

## 2019-05-11 DIAGNOSIS — G8929 Other chronic pain: Secondary | ICD-10-CM | POA: Diagnosis not present

## 2019-05-11 NOTE — Addendum Note (Signed)
Addended byCandice Camp on: 05/11/2019 09:29 AM   Modules accepted: Orders

## 2019-05-11 NOTE — Progress Notes (Signed)
Patient ID: Trevor Ayala, male   DOB: [birth year], 68 y.o.   MRN: [id number]  Chief Complaint  Patient presents with  . Knee Pain    right     Assessment and Plan:  IMAGING: I have read and interpret the xrays 3 v right knee as follows:  Impression osteoarthritis medial compartment moderate to severe with mild to moderate secondary bone changes  Diagnosis and treatment:   Osteoarthritis of the knee Torn medial meniscus  Recommend MRI right knee Economy hinge brace   No orders of the defined types were placed in this encounter.     Chief Complaint  Patient presents with  . Knee Pain    right     68 year old male presents for evaluation of popping and pain right knee for about 4 weeks  Location medial side right knee Quality dull ache Severity varies between moderate to severe, severe pain at night and when it pops Duration 4 weeks Context no trauma Associated symptoms locking and popping   Review of Systems Review of Systems  Cardiovascular:       PVCs   Musculoskeletal: Positive for back pain.  Neurological:       History of sciatica  All other systems reviewed and are negative.   Past Medical History:  Diagnosis Date  . Asymptomatic PVCs   . History of kidney stones   . Hypertension   . 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 Pre-diabetes    was dx when at over 200lbs ; reports no meds or CBG checks since losing weight   . PVC (premature ventricular contraction)    a. 2015: echo showing EF of 60-65%, no WMA, Grade 1 DD, and mild MR. NST low-risk.   . Renal calculus, left   . Right ureteral stone   . Wears glasses     Past Surgical History:  Procedure Laterality Date  . CYSTOSCOPY W/ RETROGRADES Left 10/16/2017   Procedure: CYSTOSCOPY WITH URETEROSCOPY AND  RETROGRADE PYELOGRAM;  Surgeon: Alexis Frock, MD;  [facility];  Service: Urology;  Laterality: Left;  . CYSTOSCOPY WITH RETROGRADE PYELOGRAM,  URETEROSCOPY AND STENT PLACEMENT Right 10/06/2014   Procedure: CYSTOSCOPY WITH RETROGRADE PYELOGRAM, URETEROSCOPY AND STENT PLACEMENT;  Surgeon: Alexis Frock, MD;  Location: [facility];  Service: Urology;  Laterality: Right;  . HOLMIUM LASER APPLICATION Right 40/34/7425   Procedure: HOLMIUM LASER APPLICATION;  Surgeon: Alexis Frock, MD;  Location: [facility];  Service: Urology;  Laterality: Right;  . HOLMIUM LASER APPLICATION Left 95/05/3874   Procedure: HOLMIUM LASER APPLICATION;  Surgeon: Alexis Frock, MD;  [facility];  Service: Urology;  Laterality: Left;  . NEPHROLITHOTOMY Left 10/16/2017   Procedure: NEPHROLITHOTOMY PERCUTANEOUS WITH SURGEON ACCESS;  Surgeon: Alexis Frock, MD;  [facility];  Service: Urology;  Laterality: Left;  . TRANSTHORACIC ECHOCARDIOGRAM  09-09-2014   mild LVH/  ef 64-33%/  grade I diastolic dysfunction/  mild MR/  mild sclerotic Av    No Known Allergies  Current Outpatient Medications  Medication Sig Dispense Refill  . amLODipine (NORVASC) 10 MG tablet Take 10 mg by mouth daily.    Marland Kitchen oxyCODONE-acetaminophen (PERCOCET) 10-325 MG tablet      No current facility-administered medications for this visit.      Physical Exam BP (!) 160/87   Pulse 64   Temp (!) 97.5 F (36.4 C)   Ht 5\' 8"  (1.727 m)  Wt 185 lb (83.9 kg)   BMI 28.13 kg/m     The patient is well developed well nourished and well groomed.   Orientation to person place and time is normal   Mood is pleasant. Affect normal Ambulatory status is normal no limp       Right knee examination:  Inspection: Tenderness is noted over the medial joint line   ROM: Is limited by pain with a maximum flexion arc of 125 degrees  Stability: Collateral ligaments are stable, the Lachman test and anterior and posterior drawer tests are normal   We do palpate medial joint line tenderness and a negative McMurray's   Motor exam: Grade 5 motor strength in  the quadriceps musculature   Skin: Warm dry and intact over the right leg                       Neuro: normal sensation   Vascular: 2+ DP pulse with normal color and no edema.   Currently the left lower extremity and knee examination revealed no tenderness or swelling, full range of motion without contracture subluxation atrophy or tremor. Normal muscle tone no instability and the neurovascular status of the limb is normal.      Arther Abbott, MD 05/11/2019 9:09 AM

## 2019-05-11 NOTE — Patient Instructions (Signed)
Meniscus Tear    A meniscus tear is a knee injury that happens when a piece of the meniscus is torn. The meniscus is a thick, rubbery, wedge-shaped cartilage in the knee. Two menisci are located in each knee. They sit between the upper bone (femur) and lower bone (tibia) that make up the knee joint. Each meniscus acts as a shock absorber for the knee.  A torn meniscus is one of the most common types of knee injuries. This injury can range from mild to severe. Surgery may be needed to repair a severe tear.  What are the causes?  This condition may be caused by any kneeling, squatting, twisting, or pivoting movement. Sports-related injuries are the most common cause. These often occur from:  · Running and stopping suddenly.  ? Changing direction.  ? Being tackled or knocked off your feet.  · Lifting or carrying heavy weights.  As people get older, their menisci get thinner and weaker. In these people, tears can happen more easily, such as from climbing stairs.  What increases the risk?  You are more likely to develop this condition if you:  · Play contact sports.  · Have a job that requires kneeling or squatting.  · Are male.  · Are over 40 years old.  What are the signs or symptoms?  Symptoms of this condition include:  · Knee pain, especially at the side of the knee joint. You may feel pain when the injury occurs, or you may only hear a pop and feel pain later.  · A feeling that your knee is clicking, catching, locking, or giving way.  · Not being able to fully bend or extend your knee.  · Bruising or swelling in your knee.  How is this diagnosed?  This condition may be diagnosed based on your symptoms and a physical exam.  You may also have tests, such as:  · X-rays.  · MRI.  · A procedure to look inside your knee with a narrow surgical telescope (arthroscopy).  You may be referred to a knee specialist (orthopedic surgeon).  How is this treated?  Treatment for this injury depends on the severity of the tear.  Treatment for a mild tear may include:  · Rest.  · Medicine to reduce pain and swelling. This is usually a nonsteroidal anti-inflammatory drug (NSAID), like ibuprofen.  · A knee brace, sleeve, or wrap.  · Using crutches or a walker to keep weight off your knee and to help you walk.  · Exercises to strengthen your knee (physical therapy).  You may need surgery if you have a severe tear or if other treatments are not working.  Follow these instructions at home:  If you have a brace, sleeve, or wrap:  · Wear it as told by your health care provider. Remove it only as told by your health care provider.  · Loosen the brace, sleeve, or wrap if your toes tingle, become numb, or turn cold and blue.  · Keep the brace, sleeve, or wrap clean and dry.  · If the brace, sleeve, or wrap is not waterproof:  ? Do not let it get wet.  ? Cover it with a watertight covering when you take a bath or shower.  Managing pain and swelling    · Take over-the-counter and prescription medicines only as told by your health care provider.  · If directed, put ice on your knee:  ? If you have a removable brace, sleeve, or wrap, remove it   elevate) the injured area above the level of your heart while you are sitting or lying down. Activity  Do not use the injured limb to support your body weight until your health care provider says that you can. Use crutches or a walker as told by your health care provider.  Return to your normal activities as told by your health care provider. Ask your health care provider what activities are safe for you.  Perform range-of-motion exercises only as told by your health care provider.  Begin doing exercises to strengthen your knee and leg muscles only as told by your  health care provider. After you recover, your health care provider may recommend these exercises to help prevent another injury. General instructions  Use a knee brace, sleeve, or wrap as told by your health care provider.  Ask your health care provider when it is safe to drive if you have a brace, sleeve, or wrap on your knee.  Do not use any products that contain nicotine or tobacco, such as cigarettes, e-cigarettes, and chewing tobacco. If you need help quitting, ask your health care provider.  Ask your health care provider if the medicine prescribed to you: ? Requires you to avoid driving or using heavy machinery. ? Can cause constipation. You may need to take these actions to prevent or treat constipation:  Drink enough fluid to keep your urine pale yellow.  Take over-the-counter or prescription medicines.  Eat foods that are high in fiber, such as beans, whole grains, and fresh fruits and vegetables.  Limit foods that are high in fat and processed sugars, such as fried or sweet foods.  Keep all follow-up visits as told by your health care provider. This is important. Contact a health care provider if:  You have a fever.  Your knee becomes red, tender, or swollen.  Your pain medicine is not helping.  Your symptoms get worse or do not improve after 2 weeks of home care. Summary  A meniscus tear is a knee injury that happens when a piece of the meniscus is torn.  Treatment for this injury depends on the severity of the tear. You may need surgery if you have a severe tear or if other treatments are not working.  Rest, ice, and raise (elevate) your injured knee as told by your health care provider. This will help lessen pain and swelling.  Contact a health care provider if you have new symptoms, or your symptoms get worse or do not improve after 2 weeks of home care.  Keep all follow-up visits as told by your health care provider. This is important. This information is not  intended to replace advice given to you by your health care provider. Make sure you discuss any questions you have with your health care provider. Document Released: 02/16/2003 Document Revised: 06/10/2018 Document Reviewed: 06/10/2018 Elsevier Interactive Patient Education  2019 [employer]  Osteoarthritis  Osteoarthritis is a type of arthritis that affects tissue that covers the ends of bones in joints (cartilage). Cartilage acts as a cushion between the bones and helps them move smoothly. Osteoarthritis results when cartilage in the joints gets worn down. Osteoarthritis is sometimes called "wear and tear" arthritis. Osteoarthritis is the most common form of arthritis. It often occurs in older people. It is a condition that gets worse over time (a progressive condition). Joints that are most often affected by this condition are in:  Fingers.  Toes.  Hips.  Knees.  Spine, including neck and lower back. What  are the causes? This condition is caused by age-related wearing down of cartilage that covers the ends of bones. What increases the risk? The following factors may make you more likely to develop this condition:  Older age.  Being overweight or obese.  Overuse of joints, such as in athletes.  Past injury of a joint.  Past surgery on a joint.  Family history of osteoarthritis. What are the signs or symptoms? The main symptoms of this condition are pain, swelling, and stiffness in the joint. The joint may lose its shape over time. Small pieces of bone or cartilage may break off and float inside of the joint, which may cause more pain and damage to the joint. Small deposits of bone (osteophytes) may grow on the edges of the joint. Other symptoms may include:  A grating or scraping feeling inside the joint when you move it.  Popping or creaking sounds when you move. Symptoms may affect one or more joints. Osteoarthritis in a major joint, such as your knee or hip, can make  it painful to walk or exercise. If you have osteoarthritis in your hands, you might not be able to grip items, twist your hand, or control small movements of your hands and fingers (fine motor skills). How is this diagnosed? This condition may be diagnosed based on:  Your medical history.  A physical exam.  Your symptoms.  X-rays of the affected joint(s).  Blood tests to rule out other types of arthritis. How is this treated? There is no cure for this condition, but treatment can help to control pain and improve joint function. Treatment plans may include:  A prescribed exercise program that allows for rest and joint relief. You may work with a physical therapist.  A weight control plan.  Pain relief techniques, such as: ? Applying heat and cold to the joint. ? Electric pulses delivered to nerve endings under the skin (transcutaneous electrical nerve stimulation, or TENS). ? Massage. ? Certain nutritional supplements.  NSAIDs or prescription medicines to help relieve pain.  Medicine to help relieve pain and inflammation (corticosteroids). This can be given by mouth (orally) or as an injection.  Assistive devices, such as a brace, wrap, splint, specialized glove, or cane.  Surgery, such as: ? An osteotomy. This is done to reposition the bones and relieve pain or to remove loose pieces of bone and cartilage. ? Joint replacement surgery. You may need this surgery if you have very bad (advanced) osteoarthritis. Follow these instructions at home: Activity  Rest your affected joints as directed by your health care provider.  Do not drive or use heavy machinery while taking prescription pain medicine.  Exercise as directed. Your health care provider or physical therapist may recommend specific types of exercise, such as: ? Strengthening exercises. These are done to strengthen the muscles that support joints that are affected by arthritis. They can be performed with weights or with  exercise bands to add resistance. ? Aerobic activities. These are exercises, such as brisk walking or water aerobics, that get your heart pumping. ? Range-of-motion activities. These keep your joints easy to move. ? Balance and agility exercises. Managing pain, stiffness, and swelling      If directed, apply heat to the affected area as often as told by your health care provider. Use the heat source that your health care provider recommends, such as a moist heat pack or a heating pad. ? If you have a removable assistive device, remove it as told by your  health care provider. ? Place a towel between your skin and the heat source. If your health care provider tells you to keep the assistive device on while you apply heat, place a towel between the assistive device and the heat source. ? Leave the heat on for 20-30 minutes. ? Remove the heat if your skin turns bright red. This is especially important if you are unable to feel pain, heat, or cold. You may have a greater risk of getting burned.  If directed, put ice on the affected joint: ? If you have a removable assistive device, remove it as told by your health care provider. ? Put ice in a plastic bag. ? Place a towel between your skin and the bag. If your health care provider tells you to keep the assistive device on during icing, place a towel between the assistive device and the bag. ? Leave the ice on for 20 minutes, 2-3 times a day. General instructions  Take over-the-counter and prescription medicines only as told by your health care provider.  Maintain a healthy weight. Follow instructions from your health care provider for weight control. These may include dietary restrictions.  Do not use any products that contain nicotine or tobacco, such as cigarettes and e-cigarettes. These can delay bone healing. If you need help quitting, ask your health care provider.  Use assistive devices as directed by your health care provider.  Keep  all follow-up visits as told by your health care provider. This is important. Where to find more information  [employer] of Arthritis and Musculoskeletal and Skin Diseases: www.niams.SouthExposed.es  [employer] on Aging: http://kim-miller.com/  American College of Rheumatology: www.rheumatology.org Contact a health care provider if:  Your skin turns red.  You develop a rash.  You have pain that gets worse.  You have a fever along with joint or muscle aches. Get help right away if:  You lose a lot of weight.  You suddenly lose your appetite.  You have night sweats. Summary  Osteoarthritis is a type of arthritis that affects tissue covering the ends of bones in joints (cartilage).  This condition is caused by age-related wearing down of cartilage that covers the ends of bones.  The main symptom of this condition is pain, swelling, and stiffness in the joint.  There is no cure for this condition, but treatment can help to control pain and improve joint function. This information is not intended to replace advice given to you by your health care provider. Make sure you discuss any questions you have with your health care provider. Document Released: 11/26/2005 Document Revised: 09/02/2017 Document Reviewed: 07/30/2016 Elsevier Interactive Patient Education  2019 [employer].

## 2019-05-12 ENCOUNTER — Telehealth: Payer: Self-pay | Admitting: Radiology

## 2019-05-12 NOTE — Telephone Encounter (Signed)
I called to schedule MRI scan June 11th at 11 am, arrive at 10 :18 Advised patient of appointment, Dr Aline Brochure will call with the MRI report

## 2019-05-21 ENCOUNTER — Ambulatory Visit (HOSPITAL_COMMUNITY): Payer: Medicare HMO

## 2019-05-21 DIAGNOSIS — Z1389 Encounter for screening for other disorder: Secondary | ICD-10-CM | POA: Diagnosis not present

## 2019-05-21 DIAGNOSIS — H6121 Impacted cerumen, right ear: Secondary | ICD-10-CM | POA: Diagnosis not present

## 2019-05-21 DIAGNOSIS — Z6827 Body mass index (BMI) 27.0-27.9, adult: Secondary | ICD-10-CM | POA: Diagnosis not present

## 2019-05-21 DIAGNOSIS — E663 Overweight: Secondary | ICD-10-CM | POA: Diagnosis not present

## 2019-05-21 DIAGNOSIS — G894 Chronic pain syndrome: Secondary | ICD-10-CM | POA: Diagnosis not present

## 2019-05-22 ENCOUNTER — Other Ambulatory Visit: Payer: Self-pay

## 2019-05-22 ENCOUNTER — Ambulatory Visit (HOSPITAL_COMMUNITY)
Admission: RE | Admit: 2019-05-22 | Discharge: 2019-05-22 | Disposition: A | Discharge status: Home / Self Care | Payer: Medicare HMO | Source: Ambulatory Visit | Attending: Orthopedic Surgery | Admitting: Orthopedic Surgery

## 2019-05-22 DIAGNOSIS — M25561 Pain in right knee: Secondary | ICD-10-CM | POA: Insufficient documentation

## 2019-05-22 DIAGNOSIS — G8929 Other chronic pain: Secondary | ICD-10-CM | POA: Insufficient documentation

## 2019-06-01 ENCOUNTER — Telehealth: Payer: Self-pay | Admitting: Orthopedic Surgery

## 2019-06-01 NOTE — Telephone Encounter (Signed)
Patient called stating he had MRI on 6/12 and hasn't heard from anyone. Does he need to come back in the office or can he been called with results. If virtual let me know.  [phone]

## 2019-06-01 NOTE — Telephone Encounter (Signed)
IMPRESSION: Dominant finding is osteoarthritis about the knee which is worst in the medial compartment.  Horizontal tear in the posterior horn of the medial meniscus is superimposed on extensive fraying of the free edge. The body of the medial meniscus is severely degenerated and diminutive with only a thin remnant of the posterior horn identified.  No Baker's cyst but 2 loose bodies are identified in the location of a Baker's cyst.  call [phone]

## 2019-06-01 NOTE — Telephone Encounter (Signed)
Called him again left message

## 2019-06-01 NOTE — Telephone Encounter (Signed)
I CALLED HIM SEVERAL TIMES EVEN LEFT MY PHONE NUMBER  SO I LL TRY AGAIN NO VIRTUAL VISIT

## 2019-06-03 ENCOUNTER — Encounter: Payer: Self-pay | Admitting: Orthopedic Surgery

## 2019-06-03 ENCOUNTER — Ambulatory Visit (INDEPENDENT_AMBULATORY_CARE_PROVIDER_SITE_OTHER): Payer: Medicare HMO | Admitting: Orthopedic Surgery

## 2019-06-03 ENCOUNTER — Other Ambulatory Visit: Payer: Self-pay

## 2019-06-03 VITALS — BP 160/87 | HR 70 | Ht 68.0 in | Wt 185.0 lb

## 2019-06-03 DIAGNOSIS — S83241D Other tear of medial meniscus, current injury, right knee, subsequent encounter: Secondary | ICD-10-CM | POA: Diagnosis not present

## 2019-06-03 DIAGNOSIS — M1711 Unilateral primary osteoarthritis, right knee: Secondary | ICD-10-CM

## 2019-06-03 NOTE — Progress Notes (Signed)
Chief Complaint  Patient presents with  . Knee Pain    right   . Results    review MRI     Encounter Diagnoses  Name Primary?  . Primary osteoarthritis of right knee Yes  . Acute medial meniscus tear of right knee, subsequent encounter     I m better with the brace  His MRI shows 3 compartment arthritis and he says he is better with his brace he also has a torn medial meniscus  His popping and pain have improved his gait has improved his ability to do yard activities and is improved  MRI review  Normal MRI.  3 compartment arthritis is noted with chondrosis in all 3 compartments worse on the medial side  There is also a torn medial meniscus and perhaps even some loose bodies in the popliteal fossa  He and I both agreed to hold off on surgery and the fall.  He is caring for 20-year-old and his wife who has some medical issues that require full care

## 2019-06-18 DIAGNOSIS — G894 Chronic pain syndrome: Secondary | ICD-10-CM | POA: Diagnosis not present

## 2019-06-18 DIAGNOSIS — M5136 Other intervertebral disc degeneration, lumbar region: Secondary | ICD-10-CM | POA: Diagnosis not present

## 2019-07-16 DIAGNOSIS — G4733 Obstructive sleep apnea (adult) (pediatric): Secondary | ICD-10-CM | POA: Diagnosis not present

## 2019-07-16 DIAGNOSIS — E1129 Type 2 diabetes mellitus with other diabetic kidney complication: Secondary | ICD-10-CM | POA: Diagnosis not present

## 2019-07-16 DIAGNOSIS — Z681 Body mass index (BMI) 19 or less, adult: Secondary | ICD-10-CM | POA: Diagnosis not present

## 2019-07-16 DIAGNOSIS — G894 Chronic pain syndrome: Secondary | ICD-10-CM | POA: Diagnosis not present

## 2019-07-16 DIAGNOSIS — I10 Essential (primary) hypertension: Secondary | ICD-10-CM | POA: Diagnosis not present

## 2019-08-13 DIAGNOSIS — G4709 Other insomnia: Secondary | ICD-10-CM | POA: Diagnosis not present

## 2019-08-13 DIAGNOSIS — Z6838 Body mass index (BMI) 38.0-38.9, adult: Secondary | ICD-10-CM | POA: Diagnosis not present

## 2019-08-13 DIAGNOSIS — G894 Chronic pain syndrome: Secondary | ICD-10-CM | POA: Diagnosis not present

## 2019-08-13 DIAGNOSIS — E663 Overweight: Secondary | ICD-10-CM | POA: Diagnosis not present

## 2019-09-09 ENCOUNTER — Ambulatory Visit: Payer: Medicare HMO | Admitting: Orthopedic Surgery

## 2019-09-10 DIAGNOSIS — E663 Overweight: Secondary | ICD-10-CM | POA: Diagnosis not present

## 2019-09-10 DIAGNOSIS — Z6827 Body mass index (BMI) 27.0-27.9, adult: Secondary | ICD-10-CM | POA: Diagnosis not present

## 2019-09-10 DIAGNOSIS — M5136 Other intervertebral disc degeneration, lumbar region: Secondary | ICD-10-CM | POA: Diagnosis not present

## 2019-09-10 DIAGNOSIS — G894 Chronic pain syndrome: Secondary | ICD-10-CM | POA: Diagnosis not present

## 2019-10-09 DIAGNOSIS — N1832 Chronic kidney disease, stage 3b: Secondary | ICD-10-CM | POA: Diagnosis not present

## 2019-10-09 DIAGNOSIS — G894 Chronic pain syndrome: Secondary | ICD-10-CM | POA: Diagnosis not present

## 2019-10-09 DIAGNOSIS — E663 Overweight: Secondary | ICD-10-CM | POA: Diagnosis not present

## 2019-10-09 DIAGNOSIS — E782 Mixed hyperlipidemia: Secondary | ICD-10-CM | POA: Diagnosis not present

## 2019-10-09 DIAGNOSIS — E1122 Type 2 diabetes mellitus with diabetic chronic kidney disease: Secondary | ICD-10-CM | POA: Diagnosis not present

## 2019-10-09 DIAGNOSIS — E1140 Type 2 diabetes mellitus with diabetic neuropathy, unspecified: Secondary | ICD-10-CM | POA: Diagnosis not present

## 2019-10-09 DIAGNOSIS — Z6827 Body mass index (BMI) 27.0-27.9, adult: Secondary | ICD-10-CM | POA: Diagnosis not present

## 2019-10-09 DIAGNOSIS — I129 Hypertensive chronic kidney disease with stage 1 through stage 4 chronic kidney disease, or unspecified chronic kidney disease: Secondary | ICD-10-CM | POA: Diagnosis not present

## 2019-11-02 DIAGNOSIS — N200 Calculus of kidney: Secondary | ICD-10-CM | POA: Diagnosis not present

## 2019-11-04 DIAGNOSIS — G894 Chronic pain syndrome: Secondary | ICD-10-CM | POA: Diagnosis not present

## 2019-11-04 DIAGNOSIS — E663 Overweight: Secondary | ICD-10-CM | POA: Diagnosis not present

## 2019-11-04 DIAGNOSIS — Z6827 Body mass index (BMI) 27.0-27.9, adult: Secondary | ICD-10-CM | POA: Diagnosis not present

## 2019-11-04 DIAGNOSIS — M5136 Other intervertebral disc degeneration, lumbar region: Secondary | ICD-10-CM | POA: Diagnosis not present

## 2019-11-09 DIAGNOSIS — N200 Calculus of kidney: Secondary | ICD-10-CM | POA: Diagnosis not present

## 2019-11-09 DIAGNOSIS — R34 Anuria and oliguria: Secondary | ICD-10-CM | POA: Diagnosis not present

## 2019-12-07 DIAGNOSIS — I10 Essential (primary) hypertension: Secondary | ICD-10-CM | POA: Diagnosis not present

## 2019-12-07 DIAGNOSIS — E1129 Type 2 diabetes mellitus with other diabetic kidney complication: Secondary | ICD-10-CM | POA: Diagnosis not present

## 2019-12-07 DIAGNOSIS — Z6827 Body mass index (BMI) 27.0-27.9, adult: Secondary | ICD-10-CM | POA: Diagnosis not present

## 2019-12-07 DIAGNOSIS — B351 Tinea unguium: Secondary | ICD-10-CM | POA: Diagnosis not present

## 2020-01-04 DIAGNOSIS — Z1389 Encounter for screening for other disorder: Secondary | ICD-10-CM | POA: Diagnosis not present

## 2020-01-04 DIAGNOSIS — M5136 Other intervertebral disc degeneration, lumbar region: Secondary | ICD-10-CM | POA: Diagnosis not present

## 2020-01-04 DIAGNOSIS — Z0001 Encounter for general adult medical examination with abnormal findings: Secondary | ICD-10-CM | POA: Diagnosis not present

## 2020-01-04 DIAGNOSIS — E1165 Type 2 diabetes mellitus with hyperglycemia: Secondary | ICD-10-CM | POA: Diagnosis not present

## 2020-01-04 DIAGNOSIS — G894 Chronic pain syndrome: Secondary | ICD-10-CM | POA: Diagnosis not present

## 2020-01-04 DIAGNOSIS — Z6827 Body mass index (BMI) 27.0-27.9, adult: Secondary | ICD-10-CM | POA: Diagnosis not present

## 2020-01-04 DIAGNOSIS — R319 Hematuria, unspecified: Secondary | ICD-10-CM | POA: Diagnosis not present

## 2020-01-10 DIAGNOSIS — E7849 Other hyperlipidemia: Secondary | ICD-10-CM | POA: Diagnosis not present

## 2020-01-10 DIAGNOSIS — E1122 Type 2 diabetes mellitus with diabetic chronic kidney disease: Secondary | ICD-10-CM | POA: Diagnosis not present

## 2020-01-10 DIAGNOSIS — I129 Hypertensive chronic kidney disease with stage 1 through stage 4 chronic kidney disease, or unspecified chronic kidney disease: Secondary | ICD-10-CM | POA: Diagnosis not present

## 2020-01-10 DIAGNOSIS — N1832 Chronic kidney disease, stage 3b: Secondary | ICD-10-CM | POA: Diagnosis not present

## 2020-01-11 ENCOUNTER — Encounter: Payer: Self-pay | Admitting: Cardiology

## 2020-01-11 ENCOUNTER — Ambulatory Visit: Payer: Medicare HMO | Admitting: Cardiology

## 2020-01-11 ENCOUNTER — Other Ambulatory Visit: Payer: Self-pay

## 2020-01-11 VITALS — BP 171/104 | HR 82 | Temp 98.5°F | Ht 68.0 in | Wt 181.0 lb

## 2020-01-11 DIAGNOSIS — I10 Essential (primary) hypertension: Secondary | ICD-10-CM

## 2020-01-11 DIAGNOSIS — G8929 Other chronic pain: Secondary | ICD-10-CM | POA: Diagnosis not present

## 2020-01-11 DIAGNOSIS — Z0000 Encounter for general adult medical examination without abnormal findings: Secondary | ICD-10-CM | POA: Diagnosis not present

## 2020-01-11 DIAGNOSIS — Z0001 Encounter for general adult medical examination with abnormal findings: Secondary | ICD-10-CM | POA: Diagnosis not present

## 2020-01-11 DIAGNOSIS — R319 Hematuria, unspecified: Secondary | ICD-10-CM | POA: Diagnosis not present

## 2020-01-11 DIAGNOSIS — I493 Ventricular premature depolarization: Secondary | ICD-10-CM | POA: Diagnosis not present

## 2020-01-11 DIAGNOSIS — E7849 Other hyperlipidemia: Secondary | ICD-10-CM | POA: Diagnosis not present

## 2020-01-11 NOTE — Progress Notes (Signed)
Clinical Summary Trevor Ayala is a 69 y.o.male  seen today for follow up of the following medical problems.    1. Irregular heart beat/PVCs - completed holter monitor which showed fairly frequent PVCs (approx 12000), 2 short runs of NSVT. - echo and stress competed without significant abnormalities.     - no recent palpitations  2. HTN - he is compliant with meds - very high stress   - reports bp with pcp was ok over last few checks   SH: retired 2 years ago. Has custody of 60 year old granddaughter. 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    Past Medical History:  Diagnosis Date  . Asymptomatic PVCs   . History of kidney stones   . Hypertension   . 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 Pre-diabetes    was dx when at over 200lbs ; reports no meds or CBG checks since losing weight   . PVC (premature ventricular contraction)    a. 2015: echo showing EF of 60-65%, no WMA, Grade 1 DD, and mild MR. NST low-risk.   . Renal calculus, left   . Right ureteral stone   . Wears glasses      No Known Allergies   Current Outpatient Medications  Medication Sig Dispense Refill  . amLODipine (NORVASC) 10 MG tablet Take 10 mg by mouth daily.    Marland Kitchen oxyCODONE-acetaminophen (PERCOCET) 10-325 MG tablet      No current facility-administered medications for this visit.     Past Surgical History:  Procedure Laterality Date  . CYSTOSCOPY W/ RETROGRADES Left 10/16/2017   Procedure: CYSTOSCOPY WITH URETEROSCOPY AND  RETROGRADE PYELOGRAM;  Surgeon: Alexis Frock, MD;  Location: [facility];  Service: Urology;  Laterality: Left;  . CYSTOSCOPY WITH RETROGRADE PYELOGRAM, URETEROSCOPY AND STENT PLACEMENT Right 10/06/2014   Procedure: CYSTOSCOPY WITH RETROGRADE PYELOGRAM, URETEROSCOPY AND STENT PLACEMENT;  Surgeon: Alexis Frock, MD;  Location: [facility];  Service: Urology;  Laterality:  Right;  . HOLMIUM LASER APPLICATION Right 82/80/0349   Procedure: HOLMIUM LASER APPLICATION;  Surgeon: Alexis Frock, MD;  Location: [facility];  Service: Urology;  Laterality: Right;  . HOLMIUM LASER APPLICATION Left 17/08/1504   Procedure: HOLMIUM LASER APPLICATION;  Surgeon: Alexis Frock, MD;  Location: [facility];  Service: Urology;  Laterality: Left;  . NEPHROLITHOTOMY Left 10/16/2017   Procedure: NEPHROLITHOTOMY PERCUTANEOUS WITH SURGEON ACCESS;  Surgeon: Alexis Frock, MD;  Location: [facility];  Service: Urology;  Laterality: Left;  . TRANSTHORACIC ECHOCARDIOGRAM  09-09-2014   mild LVH/  ef 69-79%/  grade I diastolic dysfunction/  mild MR/  mild sclerotic Av     No Known Allergies    Family History  Problem Relation Age of Onset  . Cancer Mother   . Cancer Brother      Social History Trevor Ayala reports that he has never smoked. He has never used smokeless tobacco. Trevor Ayala reports no history of alcohol use.   Review of Systems CONSTITUTIONAL: No weight loss, fever, chills, weakness or fatigue.  HEENT: Eyes: No visual loss, blurred vision, double vision or yellow sclerae.No hearing loss, sneezing, congestion, runny nose or sore throat.  SKIN: No rash or itching.  CARDIOVASCULAR: per hpi RESPIRATORY: No shortness of breath, cough or sputum.  GASTROINTESTINAL: No anorexia, nausea, vomiting or diarrhea. No abdominal pain or blood.  GENITOURINARY: No burning on urination, no polyuria  NEUROLOGICAL: No headache, dizziness, syncope, paralysis, ataxia, numbness or tingling in the extremities. No change in bowel or bladder control.  MUSCULOSKELETAL: No muscle, back pain, joint pain or stiffness.  LYMPHATICS: No enlarged nodes. No history of splenectomy.  PSYCHIATRIC: No history of depression or anxiety.  ENDOCRINOLOGIC: No reports of sweating, cold or heat intolerance. No polyuria or polydipsia.  Marland Kitchen   Physical Examination Today's Vitals   01/11/20 0814  BP:  (!) 171/104  Pulse: 82  Temp: 98.5 F (36.9 C)  SpO2: 98%  Weight: 181 lb (82.1 kg)  Height: _0  (1.727 m)   Body mass index is 27.52 kg/m.  Gen: resting comfortably, no acute distress HEENT: no scleral icterus, pupils equal round and reactive, no palptable cervical adenopathy,  CV: RRR, no m/r/g, no jvd Resp: Clear to auscultation bilaterally GI: abdomen is soft, non-tender, non-distended, normal bowel sounds, no hepatosplenomegaly MSK: extremities are warm, no edema.  Skin: warm, no rash Neuro:  no focal deficits Psych: appropriate affect   Diagnostic Studies 09/2014 Echo Study Conclusions  - Left ventricle: The cavity size was normal. Wall thickness was increased in a pattern of mild LVH. Systolic function was normal. The estimated ejection fraction was in the range of 60% to 65%. Wall motion was normal; there were no regional wall motion abnormalities. Doppler parameters are consistent with abnormal left ventricular relaxation (grade 1 diastolic dysfunction). Doppler parameters are consistent with elevated ventricular end-diastolic filling pressure. - Aortic valve: Mildly calcified annulus. Trileaflet; mildly calcified leaflets. - Mitral valve: Calcified annulus. There was mild regurgitation. - Right atrium: Central venous pressure (est): 3 mm Hg. - Tricuspid valve: There was trivial regurgitation. - Pulmonary arteries: Systolic pressure could not be accurately estimated. - Pericardium, extracardiac: There was no pericardial effusion.  Impressions:  - Mild LVH with LVEF 22-44%, grade 1 diastolic dysfunction with increased filling pressures. MAC with mild mitral regurgitation. Mildly sclerotic aortic valve. Trivial tricuspid regurgitation, unable to assess PASP.  10/2014 MPI IMPRESSION: 1. No reversible ischemia or infarction.  2. Normal left ventricular wall motion.  3. Left ventricular ejection fraction 64%  4. Low-risk  stress test findings*.  08/2014 Holter Frequent PVCs, 2 runs NSVT 6 and 5 beats     Assessment and Plan   1. Irregular heart beat/PVCs - several year history, asymptomatic - negative workup for underlying heart disease with echo and stress test - no symptoms, continue to monitor  2. HTN - elevated today, he reports has been controlled at recent pcp visit - he will call us later this week to update [place] on home bp's. We will request pcp notes   F/u 1 year     Arnoldo Lenis, M.D

## 2020-01-11 NOTE — Patient Instructions (Signed)
Medication Instructions:  Your physician recommends that you continue on your current medications as directed. Please refer to the Current Medication list given to you today.   Labwork: I will request labs from pcp   Testing/Procedures: none  Follow-Up: Your physician wants you to follow-up in: 1 year.  You will receive a reminder letter in the mail two months in advance. If you don't receive a letter, please call our office to schedule the follow-up appointment.   Any Other Special Instructions Will Be Listed Below (If Applicable).  Please call later this week to update on blood pressure    If you need a refill on your cardiac medications before your next appointment, please call your pharmacy.

## 2020-01-20 DIAGNOSIS — E663 Overweight: Secondary | ICD-10-CM | POA: Diagnosis not present

## 2020-01-20 DIAGNOSIS — Z6826 Body mass index (BMI) 26.0-26.9, adult: Secondary | ICD-10-CM | POA: Diagnosis not present

## 2020-01-20 DIAGNOSIS — I10 Essential (primary) hypertension: Secondary | ICD-10-CM | POA: Diagnosis not present

## 2020-01-20 DIAGNOSIS — E119 Type 2 diabetes mellitus without complications: Secondary | ICD-10-CM | POA: Diagnosis not present

## 2020-02-01 DIAGNOSIS — Z681 Body mass index (BMI) 19 or less, adult: Secondary | ICD-10-CM | POA: Diagnosis not present

## 2020-02-01 DIAGNOSIS — G8929 Other chronic pain: Secondary | ICD-10-CM | POA: Diagnosis not present

## 2020-02-01 DIAGNOSIS — I10 Essential (primary) hypertension: Secondary | ICD-10-CM | POA: Diagnosis not present

## 2020-02-05 IMAGING — MR MRI OF THE RIGHT KNEE WITHOUT CONTRAST
4 of 7 series · 20 of 40 positions shown · non-contrast
Comparison: Plain films right knee 05/11/2019.

CLINICAL DATA: Anterior right knee pain for 4 weeks. No known
injury.

EXAM:
MRI OF THE RIGHT KNEE WITHOUT CONTRAST
TECHNIQUE: Multiplanar, multisequence MR imaging of the knee was performed. No
intravenous contrast was administered.

[Series 5: T2 fat-sat · coronal · 4.0mm · 0.26mm/px · 4 of 26 slices shown]
[im 1/26]
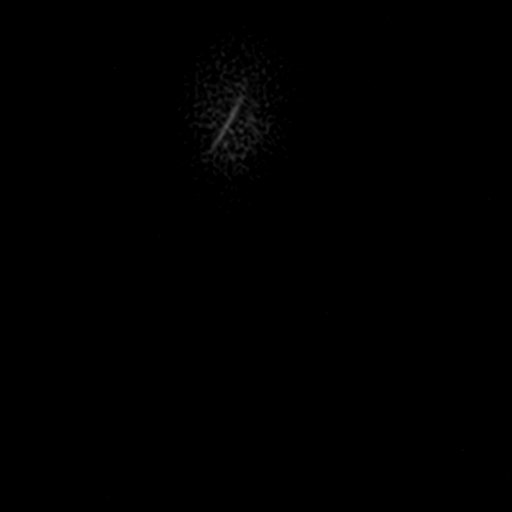
[im 6/26]
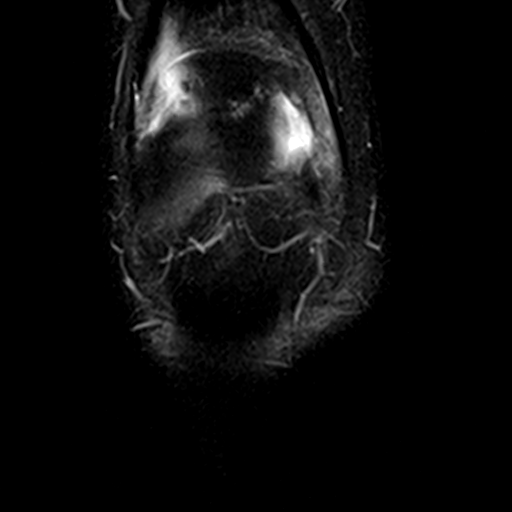
[im 16/26]
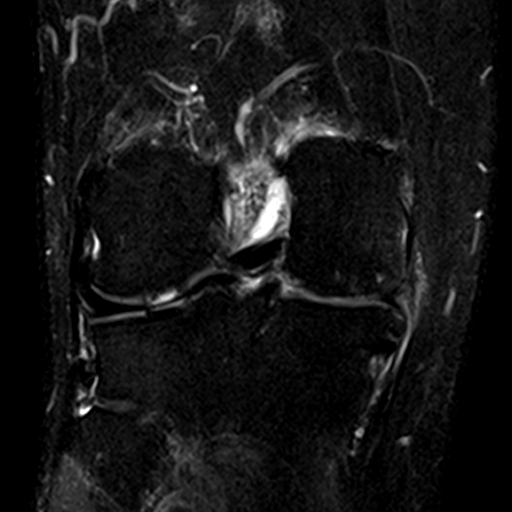
[im 26/26]
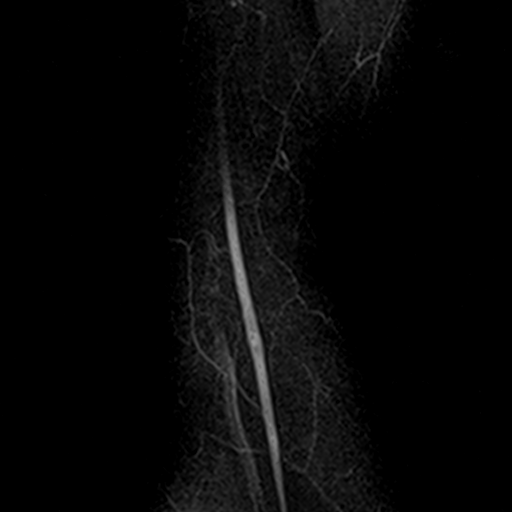

[Series 6: PD fat-sat · coronal · 3.0mm · 0.41mm/px · 7 of 32 slices shown (1 of 3)]
[im 1/32]
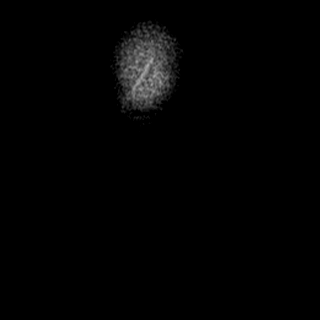
[im 6/32]
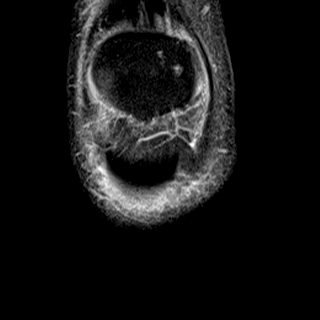
[im 11/32]
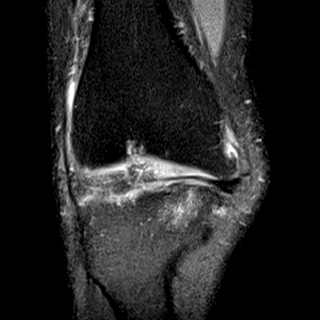
[im 16/32]
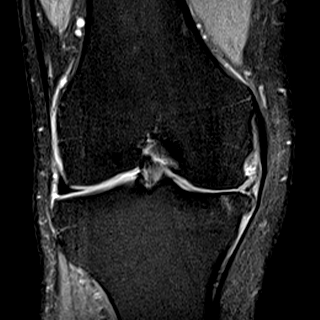
[im 21/32]
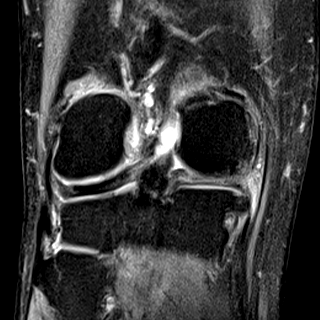
[im 26/32]
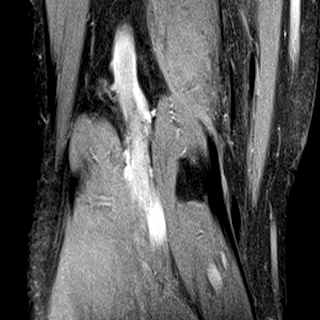
[im 32/32]
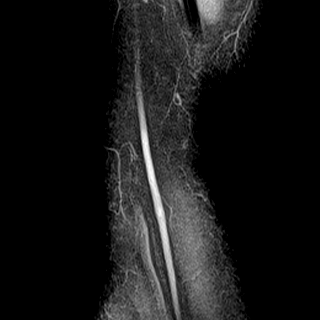

[Series 7: PD fat-sat · sagittal · 3.0mm · 0.22mm/px · 6 of 27 slices shown (2 of 3)]
[im 1/27]
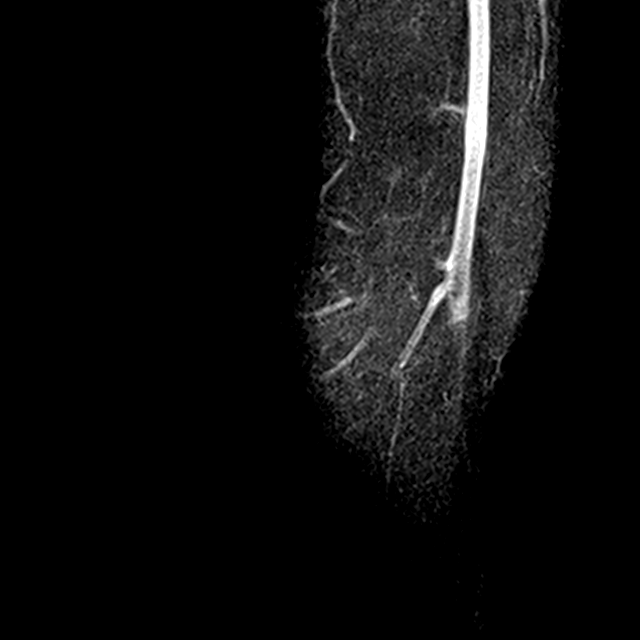
[im 6/27]
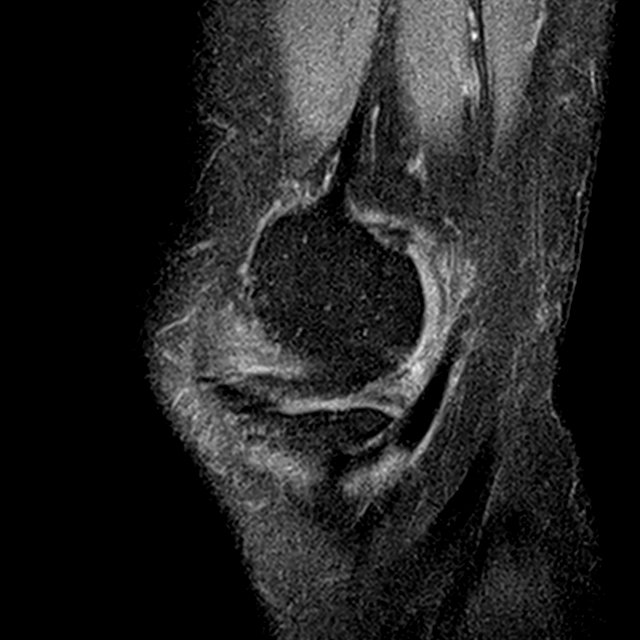
[im 11/27]
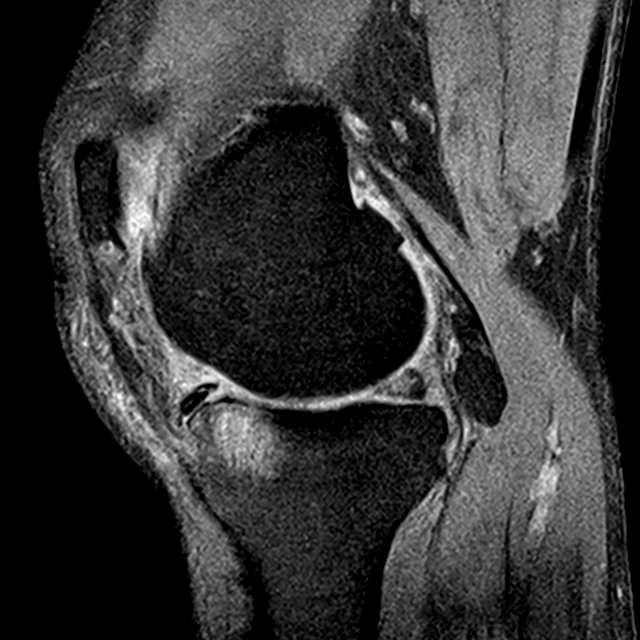
[im 16/27]
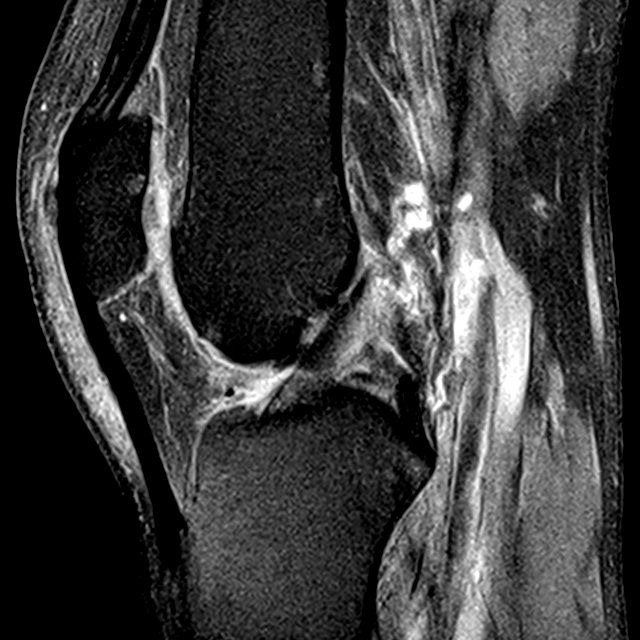
[im 21/27]
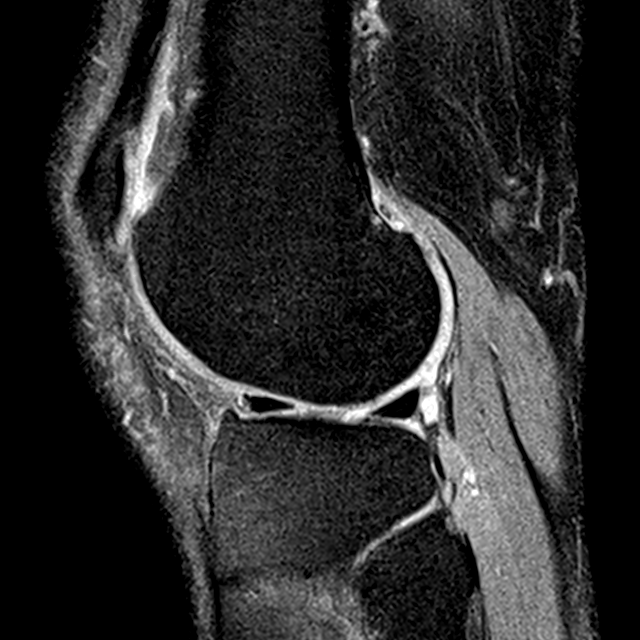
[im 27/27]
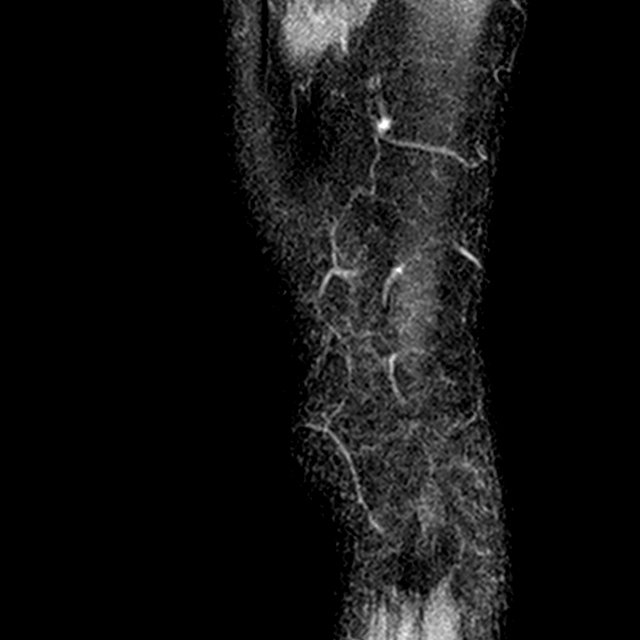

[Series 10: PD fat-sat · coronal · 2.0mm · 0.22mm/px · 3 of 15 slices shown (3 of 3)]
[im 1/15]
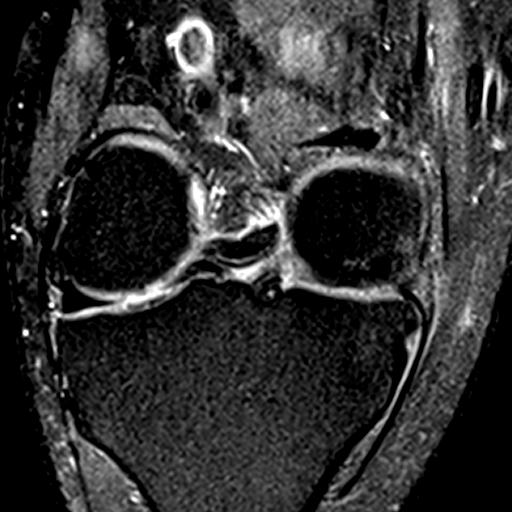
[im 8/15]
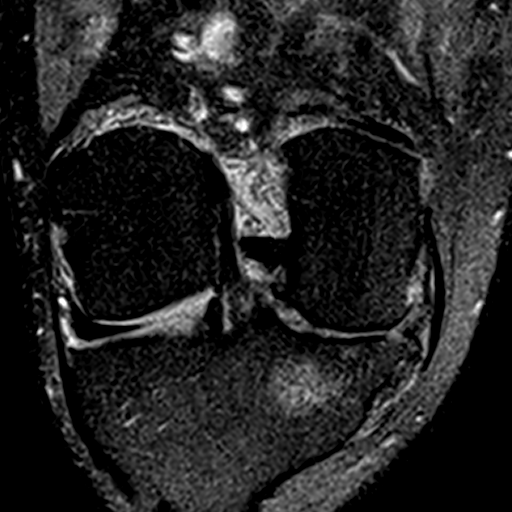
[im 15/15]
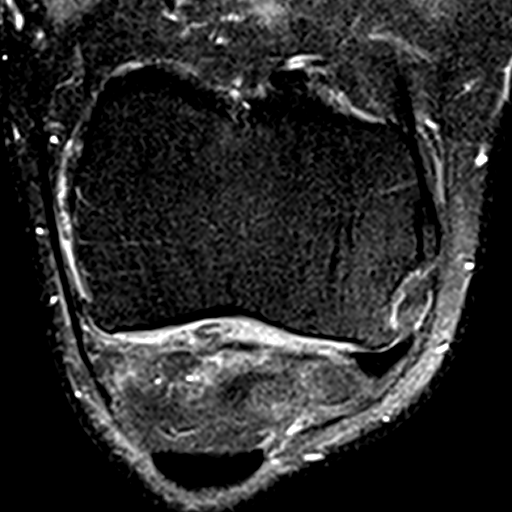

[20 of 40 positions shown; findings below may reference images not displayed]

FINDINGS: MENISCI

Medial meniscus: The body is severely degenerated and diminutive
with only a thin remnant of the peripheral body identified. There is
a horizontal tear in the posterior horn reaching the meniscal
undersurface and blunting along the free edge of the posterior horn.

Lateral meniscus:  Intact.

LIGAMENTS

Cruciates:  Intact.

Collaterals:  Intact.

CARTILAGE

Patellofemoral: Cartilage thinning facet is worst along the medial
patellar in the superior pole.

Medial: Markedly thinned throughout with associated joint space
narrowing.

Lateral:  Moderately degenerated and irregular.

Joint:  Trace amount of joint fluid.

Popliteal Fossa: No Baker's cyst. There are 2 ossifications in the
location of a Baker's cyst. The larger measures 1.4 cm transverse by
1 cm AP by 1.9 cm craniocaudal.

Extensor Mechanism:  Intact.

Bones: No fracture or focal lesion. Osteophytosis about the knee is
worst medially. Subchondral edema is seen in the anterior aspect of
the tibial plateau. Small foci of subchondral edema are also seen in
the medial patellar facet.

Other: None
IMPRESSION: Dominant finding is osteoarthritis about the knee which is worst in
the medial compartment.

Horizontal tear in the posterior horn of the medial meniscus is
superimposed on extensive fraying of the free edge. The body of the
medial meniscus is severely degenerated and diminutive with only a
thin remnant of the posterior horn identified.

No Baker's cyst but 2 loose bodies are identified in the location of
a Baker's cyst.

## 2020-02-29 DIAGNOSIS — E1140 Type 2 diabetes mellitus with diabetic neuropathy, unspecified: Secondary | ICD-10-CM | POA: Diagnosis not present

## 2020-02-29 DIAGNOSIS — I10 Essential (primary) hypertension: Secondary | ICD-10-CM | POA: Diagnosis not present

## 2020-02-29 DIAGNOSIS — E663 Overweight: Secondary | ICD-10-CM | POA: Diagnosis not present

## 2020-02-29 DIAGNOSIS — B351 Tinea unguium: Secondary | ICD-10-CM | POA: Diagnosis not present

## 2020-02-29 DIAGNOSIS — Z6826 Body mass index (BMI) 26.0-26.9, adult: Secondary | ICD-10-CM | POA: Diagnosis not present

## 2020-02-29 DIAGNOSIS — E1142 Type 2 diabetes mellitus with diabetic polyneuropathy: Secondary | ICD-10-CM | POA: Diagnosis not present

## 2020-02-29 DIAGNOSIS — G894 Chronic pain syndrome: Secondary | ICD-10-CM | POA: Diagnosis not present

## 2020-03-09 DIAGNOSIS — I129 Hypertensive chronic kidney disease with stage 1 through stage 4 chronic kidney disease, or unspecified chronic kidney disease: Secondary | ICD-10-CM | POA: Diagnosis not present

## 2020-03-09 DIAGNOSIS — E7849 Other hyperlipidemia: Secondary | ICD-10-CM | POA: Diagnosis not present

## 2020-03-09 DIAGNOSIS — E1122 Type 2 diabetes mellitus with diabetic chronic kidney disease: Secondary | ICD-10-CM | POA: Diagnosis not present

## 2020-03-09 DIAGNOSIS — N1832 Chronic kidney disease, stage 3b: Secondary | ICD-10-CM | POA: Diagnosis not present

## 2020-03-28 DIAGNOSIS — Z6827 Body mass index (BMI) 27.0-27.9, adult: Secondary | ICD-10-CM | POA: Diagnosis not present

## 2020-03-28 DIAGNOSIS — E663 Overweight: Secondary | ICD-10-CM | POA: Diagnosis not present

## 2020-03-28 DIAGNOSIS — G894 Chronic pain syndrome: Secondary | ICD-10-CM | POA: Diagnosis not present

## 2020-04-25 DIAGNOSIS — E663 Overweight: Secondary | ICD-10-CM | POA: Diagnosis not present

## 2020-04-25 DIAGNOSIS — Z6827 Body mass index (BMI) 27.0-27.9, adult: Secondary | ICD-10-CM | POA: Diagnosis not present

## 2020-04-25 DIAGNOSIS — G894 Chronic pain syndrome: Secondary | ICD-10-CM | POA: Diagnosis not present

## 2020-04-25 DIAGNOSIS — G4700 Insomnia, unspecified: Secondary | ICD-10-CM | POA: Diagnosis not present

## 2020-05-09 DIAGNOSIS — I129 Hypertensive chronic kidney disease with stage 1 through stage 4 chronic kidney disease, or unspecified chronic kidney disease: Secondary | ICD-10-CM | POA: Diagnosis not present

## 2020-05-09 DIAGNOSIS — E1122 Type 2 diabetes mellitus with diabetic chronic kidney disease: Secondary | ICD-10-CM | POA: Diagnosis not present

## 2020-05-09 DIAGNOSIS — N1832 Chronic kidney disease, stage 3b: Secondary | ICD-10-CM | POA: Diagnosis not present

## 2020-05-09 DIAGNOSIS — E7849 Other hyperlipidemia: Secondary | ICD-10-CM | POA: Diagnosis not present

## 2020-05-23 DIAGNOSIS — G894 Chronic pain syndrome: Secondary | ICD-10-CM | POA: Diagnosis not present

## 2020-05-23 DIAGNOSIS — F329 Major depressive disorder, single episode, unspecified: Secondary | ICD-10-CM | POA: Diagnosis not present

## 2020-05-23 DIAGNOSIS — Z6826 Body mass index (BMI) 26.0-26.9, adult: Secondary | ICD-10-CM | POA: Diagnosis not present

## 2020-05-23 DIAGNOSIS — E1140 Type 2 diabetes mellitus with diabetic neuropathy, unspecified: Secondary | ICD-10-CM | POA: Diagnosis not present

## 2020-05-24 DIAGNOSIS — E559 Vitamin D deficiency, unspecified: Secondary | ICD-10-CM | POA: Diagnosis not present

## 2020-05-24 DIAGNOSIS — R5383 Other fatigue: Secondary | ICD-10-CM | POA: Diagnosis not present

## 2020-05-24 DIAGNOSIS — Z79891 Long term (current) use of opiate analgesic: Secondary | ICD-10-CM | POA: Diagnosis not present

## 2020-06-01 DIAGNOSIS — E538 Deficiency of other specified B group vitamins: Secondary | ICD-10-CM | POA: Diagnosis not present

## 2020-07-18 DIAGNOSIS — Z6827 Body mass index (BMI) 27.0-27.9, adult: Secondary | ICD-10-CM | POA: Diagnosis not present

## 2020-07-18 DIAGNOSIS — E538 Deficiency of other specified B group vitamins: Secondary | ICD-10-CM | POA: Diagnosis not present

## 2020-07-18 DIAGNOSIS — E663 Overweight: Secondary | ICD-10-CM | POA: Diagnosis not present

## 2020-07-18 DIAGNOSIS — G4709 Other insomnia: Secondary | ICD-10-CM | POA: Diagnosis not present

## 2020-07-18 DIAGNOSIS — G894 Chronic pain syndrome: Secondary | ICD-10-CM | POA: Diagnosis not present

## 2020-08-16 DIAGNOSIS — E1140 Type 2 diabetes mellitus with diabetic neuropathy, unspecified: Secondary | ICD-10-CM | POA: Diagnosis not present

## 2020-08-16 DIAGNOSIS — E538 Deficiency of other specified B group vitamins: Secondary | ICD-10-CM | POA: Diagnosis not present

## 2020-08-16 DIAGNOSIS — I10 Essential (primary) hypertension: Secondary | ICD-10-CM | POA: Diagnosis not present

## 2020-08-16 DIAGNOSIS — E1129 Type 2 diabetes mellitus with other diabetic kidney complication: Secondary | ICD-10-CM | POA: Diagnosis not present

## 2020-08-16 DIAGNOSIS — L84 Corns and callosities: Secondary | ICD-10-CM | POA: Diagnosis not present

## 2020-08-16 DIAGNOSIS — G894 Chronic pain syndrome: Secondary | ICD-10-CM | POA: Diagnosis not present

## 2020-08-16 DIAGNOSIS — Z6827 Body mass index (BMI) 27.0-27.9, adult: Secondary | ICD-10-CM | POA: Diagnosis not present

## 2020-09-08 DIAGNOSIS — E7849 Other hyperlipidemia: Secondary | ICD-10-CM | POA: Diagnosis not present

## 2020-09-08 DIAGNOSIS — N1832 Chronic kidney disease, stage 3b: Secondary | ICD-10-CM | POA: Diagnosis not present

## 2020-09-08 DIAGNOSIS — I129 Hypertensive chronic kidney disease with stage 1 through stage 4 chronic kidney disease, or unspecified chronic kidney disease: Secondary | ICD-10-CM | POA: Diagnosis not present

## 2020-09-08 DIAGNOSIS — E1122 Type 2 diabetes mellitus with diabetic chronic kidney disease: Secondary | ICD-10-CM | POA: Diagnosis not present

## 2020-09-13 DIAGNOSIS — G894 Chronic pain syndrome: Secondary | ICD-10-CM | POA: Diagnosis not present

## 2020-09-13 DIAGNOSIS — E663 Overweight: Secondary | ICD-10-CM | POA: Diagnosis not present

## 2020-09-13 DIAGNOSIS — Z23 Encounter for immunization: Secondary | ICD-10-CM | POA: Diagnosis not present

## 2020-09-13 DIAGNOSIS — Z6827 Body mass index (BMI) 27.0-27.9, adult: Secondary | ICD-10-CM | POA: Diagnosis not present

## 2020-09-13 DIAGNOSIS — E538 Deficiency of other specified B group vitamins: Secondary | ICD-10-CM | POA: Diagnosis not present

## 2020-09-13 DIAGNOSIS — M5136 Other intervertebral disc degeneration, lumbar region: Secondary | ICD-10-CM | POA: Diagnosis not present

## 2020-10-11 DIAGNOSIS — B070 Plantar wart: Secondary | ICD-10-CM | POA: Diagnosis not present

## 2020-10-11 DIAGNOSIS — G894 Chronic pain syndrome: Secondary | ICD-10-CM | POA: Diagnosis not present

## 2020-10-11 DIAGNOSIS — E663 Overweight: Secondary | ICD-10-CM | POA: Diagnosis not present

## 2020-10-11 DIAGNOSIS — Z6828 Body mass index (BMI) 28.0-28.9, adult: Secondary | ICD-10-CM | POA: Diagnosis not present

## 2020-11-08 DIAGNOSIS — I10 Essential (primary) hypertension: Secondary | ICD-10-CM | POA: Diagnosis not present

## 2020-11-08 DIAGNOSIS — E663 Overweight: Secondary | ICD-10-CM | POA: Diagnosis not present

## 2020-11-08 DIAGNOSIS — E1129 Type 2 diabetes mellitus with other diabetic kidney complication: Secondary | ICD-10-CM | POA: Diagnosis not present

## 2020-11-08 DIAGNOSIS — Z6827 Body mass index (BMI) 27.0-27.9, adult: Secondary | ICD-10-CM | POA: Diagnosis not present

## 2020-11-08 DIAGNOSIS — E538 Deficiency of other specified B group vitamins: Secondary | ICD-10-CM | POA: Diagnosis not present

## 2020-11-08 DIAGNOSIS — G894 Chronic pain syndrome: Secondary | ICD-10-CM | POA: Diagnosis not present

## 2020-12-06 DIAGNOSIS — G894 Chronic pain syndrome: Secondary | ICD-10-CM | POA: Diagnosis not present

## 2020-12-06 DIAGNOSIS — E538 Deficiency of other specified B group vitamins: Secondary | ICD-10-CM | POA: Diagnosis not present

## 2020-12-06 DIAGNOSIS — R053 Chronic cough: Secondary | ICD-10-CM | POA: Diagnosis not present

## 2020-12-06 DIAGNOSIS — Z6827 Body mass index (BMI) 27.0-27.9, adult: Secondary | ICD-10-CM | POA: Diagnosis not present

## 2020-12-06 DIAGNOSIS — Z125 Encounter for screening for malignant neoplasm of prostate: Secondary | ICD-10-CM | POA: Diagnosis not present

## 2020-12-06 DIAGNOSIS — D519 Vitamin B12 deficiency anemia, unspecified: Secondary | ICD-10-CM | POA: Diagnosis not present

## 2020-12-06 DIAGNOSIS — N200 Calculus of kidney: Secondary | ICD-10-CM | POA: Diagnosis not present

## 2020-12-09 DIAGNOSIS — E7849 Other hyperlipidemia: Secondary | ICD-10-CM | POA: Diagnosis not present

## 2020-12-09 DIAGNOSIS — N1832 Chronic kidney disease, stage 3b: Secondary | ICD-10-CM | POA: Diagnosis not present

## 2020-12-09 DIAGNOSIS — E1122 Type 2 diabetes mellitus with diabetic chronic kidney disease: Secondary | ICD-10-CM | POA: Diagnosis not present

## 2020-12-09 DIAGNOSIS — I129 Hypertensive chronic kidney disease with stage 1 through stage 4 chronic kidney disease, or unspecified chronic kidney disease: Secondary | ICD-10-CM | POA: Diagnosis not present

## 2020-12-13 DIAGNOSIS — N200 Calculus of kidney: Secondary | ICD-10-CM | POA: Diagnosis not present

## 2020-12-13 DIAGNOSIS — R34 Anuria and oliguria: Secondary | ICD-10-CM | POA: Diagnosis not present

## 2020-12-21 DIAGNOSIS — Z1212 Encounter for screening for malignant neoplasm of rectum: Secondary | ICD-10-CM | POA: Diagnosis not present

## 2020-12-21 DIAGNOSIS — Z1211 Encounter for screening for malignant neoplasm of colon: Secondary | ICD-10-CM | POA: Diagnosis not present

## 2021-01-01 LAB — COLOGUARD: COLOGUARD: POSITIVE — AB

## 2021-01-03 DIAGNOSIS — E119 Type 2 diabetes mellitus without complications: Secondary | ICD-10-CM | POA: Diagnosis not present

## 2021-01-03 DIAGNOSIS — Z0000 Encounter for general adult medical examination without abnormal findings: Secondary | ICD-10-CM | POA: Diagnosis not present

## 2021-01-03 DIAGNOSIS — E1165 Type 2 diabetes mellitus with hyperglycemia: Secondary | ICD-10-CM | POA: Diagnosis not present

## 2021-01-03 DIAGNOSIS — Z6827 Body mass index (BMI) 27.0-27.9, adult: Secondary | ICD-10-CM | POA: Diagnosis not present

## 2021-01-03 DIAGNOSIS — Z1331 Encounter for screening for depression: Secondary | ICD-10-CM | POA: Diagnosis not present

## 2021-01-03 DIAGNOSIS — G8929 Other chronic pain: Secondary | ICD-10-CM | POA: Diagnosis not present

## 2021-01-03 DIAGNOSIS — Z1389 Encounter for screening for other disorder: Secondary | ICD-10-CM | POA: Diagnosis not present

## 2021-01-07 DIAGNOSIS — N1832 Chronic kidney disease, stage 3b: Secondary | ICD-10-CM | POA: Diagnosis not present

## 2021-01-07 DIAGNOSIS — I129 Hypertensive chronic kidney disease with stage 1 through stage 4 chronic kidney disease, or unspecified chronic kidney disease: Secondary | ICD-10-CM | POA: Diagnosis not present

## 2021-01-07 DIAGNOSIS — E1122 Type 2 diabetes mellitus with diabetic chronic kidney disease: Secondary | ICD-10-CM | POA: Diagnosis not present

## 2021-01-07 DIAGNOSIS — E7849 Other hyperlipidemia: Secondary | ICD-10-CM | POA: Diagnosis not present

## 2021-01-16 ENCOUNTER — Encounter: Payer: Self-pay | Admitting: Internal Medicine

## 2021-01-24 DIAGNOSIS — H524 Presbyopia: Secondary | ICD-10-CM | POA: Diagnosis not present

## 2021-01-24 DIAGNOSIS — E119 Type 2 diabetes mellitus without complications: Secondary | ICD-10-CM | POA: Diagnosis not present

## 2021-01-27 DIAGNOSIS — Z0100 Encounter for examination of eyes and vision without abnormal findings: Secondary | ICD-10-CM | POA: Diagnosis not present

## 2021-01-31 DIAGNOSIS — E538 Deficiency of other specified B group vitamins: Secondary | ICD-10-CM | POA: Diagnosis not present

## 2021-01-31 DIAGNOSIS — Z6827 Body mass index (BMI) 27.0-27.9, adult: Secondary | ICD-10-CM | POA: Diagnosis not present

## 2021-01-31 DIAGNOSIS — E1165 Type 2 diabetes mellitus with hyperglycemia: Secondary | ICD-10-CM | POA: Diagnosis not present

## 2021-01-31 DIAGNOSIS — I10 Essential (primary) hypertension: Secondary | ICD-10-CM | POA: Diagnosis not present

## 2021-01-31 DIAGNOSIS — G894 Chronic pain syndrome: Secondary | ICD-10-CM | POA: Diagnosis not present

## 2021-01-31 DIAGNOSIS — K648 Other hemorrhoids: Secondary | ICD-10-CM | POA: Diagnosis not present

## 2021-02-06 DIAGNOSIS — E7849 Other hyperlipidemia: Secondary | ICD-10-CM | POA: Diagnosis not present

## 2021-02-06 DIAGNOSIS — N1832 Chronic kidney disease, stage 3b: Secondary | ICD-10-CM | POA: Diagnosis not present

## 2021-02-06 DIAGNOSIS — I129 Hypertensive chronic kidney disease with stage 1 through stage 4 chronic kidney disease, or unspecified chronic kidney disease: Secondary | ICD-10-CM | POA: Diagnosis not present

## 2021-02-06 DIAGNOSIS — E1122 Type 2 diabetes mellitus with diabetic chronic kidney disease: Secondary | ICD-10-CM | POA: Diagnosis not present

## 2021-02-23 ENCOUNTER — Ambulatory Visit: Payer: Medicare HMO | Admitting: Nurse Practitioner

## 2021-02-28 DIAGNOSIS — G8929 Other chronic pain: Secondary | ICD-10-CM | POA: Diagnosis not present

## 2021-02-28 DIAGNOSIS — Z6826 Body mass index (BMI) 26.0-26.9, adult: Secondary | ICD-10-CM | POA: Diagnosis not present

## 2021-02-28 DIAGNOSIS — E538 Deficiency of other specified B group vitamins: Secondary | ICD-10-CM | POA: Diagnosis not present

## 2021-02-28 DIAGNOSIS — T466X5A Adverse effect of antihyperlipidemic and antiarteriosclerotic drugs, initial encounter: Secondary | ICD-10-CM | POA: Diagnosis not present

## 2021-02-28 DIAGNOSIS — G4709 Other insomnia: Secondary | ICD-10-CM | POA: Diagnosis not present

## 2021-02-28 DIAGNOSIS — G894 Chronic pain syndrome: Secondary | ICD-10-CM | POA: Diagnosis not present

## 2021-03-23 ENCOUNTER — Other Ambulatory Visit: Payer: Self-pay

## 2021-03-23 ENCOUNTER — Ambulatory Visit: Payer: Medicare HMO | Admitting: Nurse Practitioner

## 2021-03-23 ENCOUNTER — Telehealth: Payer: Self-pay

## 2021-03-23 ENCOUNTER — Encounter: Payer: Self-pay | Admitting: Nurse Practitioner

## 2021-03-23 DIAGNOSIS — R195 Other fecal abnormalities: Secondary | ICD-10-CM

## 2021-03-23 DIAGNOSIS — Z1211 Encounter for screening for malignant neoplasm of colon: Secondary | ICD-10-CM | POA: Diagnosis not present

## 2021-03-23 DIAGNOSIS — Z0000 Encounter for general adult medical examination without abnormal findings: Secondary | ICD-10-CM | POA: Insufficient documentation

## 2021-03-23 MED ORDER — PEG 3350-KCL-NA BICARB-NACL 420 G PO SOLR: 4000.0000 mL | ORAL | 0 refills | Status: DC

## 2021-03-23 NOTE — Telephone Encounter (Signed)
PA for TCS submitted via [employer] website. [employer]# [id number], valid 04/10/21-05/10/21.

## 2021-03-23 NOTE — H&P (View-Only) (Signed)
Primary Care Physician:  Redmond School, MD Primary Gastroenterologist:  Dr. Abbey Chatters  Chief Complaint  Patient presents with  . positive cologuard    Never had TCS prior. C/o hemorrhoids that burn/itch. Bowels are usually loose    HPI:   Trevor Ayala is a 70 y.o. male who presents on referral from primary care for positive Cologuard  No history of colonoscopy in our system.  Today states doing okay overall. He states over the last few months has had some rectal itching and burning. Has had scant tissue hematochezia associated with flares of his (likely) hemorrhoids. Has a history of loose stools which started about 6 months ago; typically has one stools a day, no more frequent. He clarified that stools not always loose, range from [place] 5-6. Denies abdominal pain, N/V, melena, fever, chills, unintentional weight loss. Denies URI or flu-like symptoms. Denies loss of sense of taste or smell. The patient has received COVID-19 vaccination(s). Denies chest pain, dyspnea, dizziness, lightheadedness, syncope, near syncope. Denies any other upper or lower GI symptoms.  Past Medical History:  Diagnosis Date  . Asymptomatic PVCs   . History of kidney stones   . Hypertension   . 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 Pre-diabetes    was dx when at over 200lbs ; reports no meds or CBG checks since losing weight   . PVC (premature ventricular contraction)    a. 2015: echo showing EF of 60-65%, no WMA, Grade 1 DD, and mild MR. NST low-risk.   . Renal calculus, left   . Right ureteral stone   . Wears glasses     Past Surgical History:  Procedure Laterality Date  . CYSTOSCOPY W/ RETROGRADES Left 10/16/2017   Procedure: CYSTOSCOPY WITH URETEROSCOPY AND  RETROGRADE PYELOGRAM;  Surgeon: Alexis Frock, MD;  Location: [facility];  Service: Urology;  Laterality: Left;  . CYSTOSCOPY WITH RETROGRADE PYELOGRAM, URETEROSCOPY AND STENT PLACEMENT Right  10/06/2014   Procedure: CYSTOSCOPY WITH RETROGRADE PYELOGRAM, URETEROSCOPY AND STENT PLACEMENT;  Surgeon: Alexis Frock, MD;  Location: [facility];  Service: Urology;  Laterality: Right;  . HOLMIUM LASER APPLICATION Right 74/25/9563   Procedure: HOLMIUM LASER APPLICATION;  Surgeon: Alexis Frock, MD;  Location: [facility];  Service: Urology;  Laterality: Right;  . HOLMIUM LASER APPLICATION Left 87/04/6432   Procedure: HOLMIUM LASER APPLICATION;  Surgeon: Alexis Frock, MD;  Location: [facility];  Service: Urology;  Laterality: Left;  . NEPHROLITHOTOMY Left 10/16/2017   Procedure: NEPHROLITHOTOMY PERCUTANEOUS WITH SURGEON ACCESS;  Surgeon: Alexis Frock, MD;  Location: [facility];  Service: Urology;  Laterality: Left;  . TRANSTHORACIC ECHOCARDIOGRAM  09-09-2014   mild LVH/  ef 29-51%/  grade I diastolic dysfunction/  mild MR/  mild sclerotic Av    Current Outpatient Medications  Medication Sig Dispense Refill  . amLODipine (NORVASC) 10 MG tablet Take 10 mg by mouth daily.    . Cyanocobalamin (B-12 COMPLIANCE INJECTION IJ) Inject as directed. Every 30 days    . lisinopril (ZESTRIL) 10 MG tablet Take 10 mg by mouth daily.    Marland Kitchen oxyCODONE-acetaminophen (PERCOCET) 10-325 MG tablet Take by mouth as needed.    . zolpidem (AMBIEN) 10 MG tablet Take 10 mg by mouth at bedtime.     No current facility-administered medications for this visit.    Allergies as of 03/23/2021  . (No Known Allergies)    Family History  Problem Relation  Age of Onset  . Cancer Mother   . Cancer Brother   . Colon cancer Neg Hx     Social History   Socioeconomic History  . Marital status: Married    Spouse name: Not on file  . Number of children: Not on file  . Years of education: Not on file  . Highest education level: Not on file  Occupational History  . Not on file  Tobacco Use  . Smoking status: Never Smoker  . Smokeless tobacco: Never Used  Vaping Use  . Vaping Use: Never  used  Substance and Sexual Activity  . Alcohol use: No    Alcohol/week: 0.0 standard drinks  . Drug use: No  . Sexual activity: Not on file  Other Topics Concern  . Not on file  Social History Narrative  . Not on file   Social Determinants of Health   Financial Resource Strain: Not on file  Food Insecurity: Not on file  Transportation Needs: Not on file  Physical Activity: Not on file  Stress: Not on file  Social Connections: Not on file  Intimate Partner Violence: Not on file    Subjective: Review of Systems  Constitutional: Negative for chills, fever, malaise/fatigue and weight loss.  HENT: Negative for congestion and sore throat.   Respiratory: Negative for cough and shortness of breath.   Cardiovascular: Negative for chest pain and palpitations.  Gastrointestinal: Negative for abdominal pain, blood in stool, constipation, diarrhea, melena, nausea and vomiting.  Musculoskeletal: Negative for joint pain and myalgias.  Skin: Negative for rash.  Neurological: Negative for dizziness and weakness.  Endo/Heme/Allergies: Does not bruise/bleed easily.  Psychiatric/Behavioral: Negative for depression. The patient is not nervous/anxious.   All other systems reviewed and are negative.      Objective: BP (!) 145/95   Pulse 88   Temp 97.7 F (36.5 C)   Ht _0  (1.727 m)   Wt 179 lb 6.4 oz (81.4 kg)   BMI 27.28 kg/m  Physical Exam Vitals and nursing note reviewed.  Constitutional:      General: He is not in acute distress.    Appearance: Normal appearance. He is normal weight. He is not ill-appearing, toxic-appearing or diaphoretic.  HENT:     Head: Normocephalic and atraumatic.     Nose: No congestion or rhinorrhea.  Eyes:     General: No scleral icterus. Cardiovascular:     Rate and Rhythm: Normal rate and regular rhythm.     Heart sounds: Normal heart sounds.  Pulmonary:     Effort: Pulmonary effort is normal.     Breath sounds: Normal breath sounds.   Abdominal:     General: Bowel sounds are normal. There is no distension.     Palpations: Abdomen is soft. There is no hepatomegaly, splenomegaly or mass.     Tenderness: There is no abdominal tenderness. There is no guarding or rebound.     Hernia: No hernia is present.  Musculoskeletal:     Cervical back: Neck supple.  Skin:    General: Skin is warm and dry.     Coloration: Skin is not jaundiced.     Findings: No bruising or rash.  Neurological:     General: No focal deficit present.     Mental Status: He is alert and oriented to person, place, and time. Mental status is at baseline.  Psychiatric:        Mood and Affect: Mood normal.        Behavior:  Behavior normal.        Thought Content: Thought content normal.      Assessment:  Very pleasant 70 year old male presents for positive Cologuard to schedule colonoscopy.  He is generally asymptomatic from a GI standpoint.  He does have what sounds to be like symptomatic hemorrhoids with rectal itching and irritation.  He has generally soft loose stools ranging from typical [place] 5-6.  No increase in frequency of his stools since it become softer.  Query med effect.  No other overt GI complaints.  We will proceed with colonoscopy at this time.  Proceed with TCS on propofol/MAC with Dr. Abbey Chatters on propofol/MAC in near future: the risks, benefits, and alternatives have been discussed with the patient in detail. The patient states understanding and desires to proceed.  ASA II   Plan: 1. Colonoscopy as described above 2. Follow-up based on post procedure recommendations or as needed for GI symptoms    Thank you for allowing [place] to participate in the care of [place], DNP, AGNP-C Adult & Gerontological Nurse Practitioner [facility]   03/23/2021 11:17 AM   Disclaimer: This note was dictated with voice recognition software. Similar sounding words can inadvertently be transcribed and  may not be corrected upon review.

## 2021-03-23 NOTE — Progress Notes (Signed)
Primary Care Physician:  Redmond School, MD Primary Gastroenterologist:  Dr. Abbey Chatters  Chief Complaint  Patient presents with  . positive cologuard    Never had TCS prior. C/o hemorrhoids that burn/itch. Bowels are usually loose    HPI:   Trevor Ayala is a 70 y.o. male who presents on referral from primary care for positive Cologuard  No history of colonoscopy in our system.  Today states doing okay overall. He states over the last few months has had some rectal itching and burning. Has had scant tissue hematochezia associated with flares of his (likely) hemorrhoids. Has a history of loose stools which started about 6 months ago; typically has one stools a day, no more frequent. He clarified that stools not always loose, range from [place] 5-6. Denies abdominal pain, N/V, melena, fever, chills, unintentional weight loss. Denies URI or flu-like symptoms. Denies loss of sense of taste or smell. The patient has received COVID-19 vaccination(s). Denies chest pain, dyspnea, dizziness, lightheadedness, syncope, near syncope. Denies any other upper or lower GI symptoms.  Past Medical History:  Diagnosis Date  . Asymptomatic PVCs   . History of kidney stones   . Hypertension   . 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 Pre-diabetes    was dx when at over 200lbs ; reports no meds or CBG checks since losing weight   . PVC (premature ventricular contraction)    a. 2015: echo showing EF of 60-65%, no WMA, Grade 1 DD, and mild MR. NST low-risk.   . Renal calculus, left   . Right ureteral stone   . Wears glasses     Past Surgical History:  Procedure Laterality Date  . CYSTOSCOPY W/ RETROGRADES Left 10/16/2017   Procedure: CYSTOSCOPY WITH URETEROSCOPY AND  RETROGRADE PYELOGRAM;  Surgeon: Alexis Frock, MD;  Location: [facility];  Service: Urology;  Laterality: Left;  . CYSTOSCOPY WITH RETROGRADE PYELOGRAM, URETEROSCOPY AND STENT PLACEMENT Right  10/06/2014   Procedure: CYSTOSCOPY WITH RETROGRADE PYELOGRAM, URETEROSCOPY AND STENT PLACEMENT;  Surgeon: Alexis Frock, MD;  Location[facility];  Service: Urology;  Laterality: Right;  . HOLMIUM LASER APPLICATION Right 61/44/3154   Procedure: HOLMIUM LASER APPLICATION;  Surgeon: Alexis Frock, MD;  Location: [facility];  Service: Urology;  Laterality: Right;  . HOLMIUM LASER APPLICATION Left 00/07/6760   Procedure: HOLMIUM LASER APPLICATION;  Surgeon: Alexis Frock, MD;  Location: [facility];  Service: Urology;  Laterality: Left;  . NEPHROLITHOTOMY Left 10/16/2017   Procedure: NEPHROLITHOTOMY PERCUTANEOUS WITH SURGEON ACCESS;  Surgeon: Alexis Frock, MD;  Location: [facility];  Service: Urology;  Laterality: Left;  . TRANSTHORACIC ECHOCARDIOGRAM  09-09-2014   mild LVH/  ef 95-09%/  grade I diastolic dysfunction/  mild MR/  mild sclerotic Av    Current Outpatient Medications  Medication Sig Dispense Refill  . amLODipine (NORVASC) 10 MG tablet Take 10 mg by mouth daily.    . Cyanocobalamin (B-12 COMPLIANCE INJECTION IJ) Inject as directed. Every 30 days    . lisinopril (ZESTRIL) 10 MG tablet Take 10 mg by mouth daily.    Marland Kitchen oxyCODONE-acetaminophen (PERCOCET) 10-325 MG tablet Take by mouth as needed.    . zolpidem (AMBIEN) 10 MG tablet Take 10 mg by mouth at bedtime.     No current facility-administered medications for this visit.    Allergies as of 03/23/2021  . (No Known Allergies)    Family History  Problem Relation  Age of Onset  . Cancer Mother   . Cancer Brother   . Colon cancer Neg Hx     Social History   Socioeconomic History  . Marital status: Married    Spouse name: Not on file  . Number of children: Not on file  . Years of education: Not on file  . Highest education level: Not on file  Occupational History  . Not on file  Tobacco Use  . Smoking status: Never Smoker  . Smokeless tobacco: Never Used  Vaping Use  . Vaping Use: Never  used  Substance and Sexual Activity  . Alcohol use: No    Alcohol/week: 0.0 standard drinks  . Drug use: No  . Sexual activity: Not on file  Other Topics Concern  . Not on file  Social History Narrative  . Not on file   Social Determinants of Health   Financial Resource Strain: Not on file  Food Insecurity: Not on file  Transportation Needs: Not on file  Physical Activity: Not on file  Stress: Not on file  Social Connections: Not on file  Intimate Partner Violence: Not on file    Subjective: Review of Systems  Constitutional: Negative for chills, fever, malaise/fatigue and weight loss.  HENT: Negative for congestion and sore throat.   Respiratory: Negative for cough and shortness of breath.   Cardiovascular: Negative for chest pain and palpitations.  Gastrointestinal: Negative for abdominal pain, blood in stool, constipation, diarrhea, melena, nausea and vomiting.  Musculoskeletal: Negative for joint pain and myalgias.  Skin: Negative for rash.  Neurological: Negative for dizziness and weakness.  Endo/Heme/Allergies: Does not bruise/bleed easily.  Psychiatric/Behavioral: Negative for depression. The patient is not nervous/anxious.   All other systems reviewed and are negative.      Objective: BP (!) 145/95   Pulse 88   Temp 97.7 F (36.5 C)   Ht 5' 8" (1.727 m)   Wt 179 lb 6.4 oz (81.4 kg)   BMI 27.28 kg/m  Physical Exam Vitals and nursing note reviewed.  Constitutional:      General: He is not in acute distress.    Appearance: Normal appearance. He is normal weight. He is not ill-appearing, toxic-appearing or diaphoretic.  HENT:     Head: Normocephalic and atraumatic.     Nose: No congestion or rhinorrhea.  Eyes:     General: No scleral icterus. Cardiovascular:     Rate and Rhythm: Normal rate and regular rhythm.     Heart sounds: Normal heart sounds.  Pulmonary:     Effort: Pulmonary effort is normal.     Breath sounds: Normal breath sounds.   Abdominal:     General: Bowel sounds are normal. There is no distension.     Palpations: Abdomen is soft. There is no hepatomegaly, splenomegaly or mass.     Tenderness: There is no abdominal tenderness. There is no guarding or rebound.     Hernia: No hernia is present.  Musculoskeletal:     Cervical back: Neck supple.  Skin:    General: Skin is warm and dry.     Coloration: Skin is not jaundiced.     Findings: No bruising or rash.  Neurological:     General: No focal deficit present.     Mental Status: He is alert and oriented to person, place, and time. Mental status is at baseline.  Psychiatric:        Mood and Affect: Mood normal.        Behavior:   Behavior normal.        Thought Content: Thought content normal.      Assessment:  Very pleasant 70 year old male presents for positive Cologuard to schedule colonoscopy.  He is generally asymptomatic from a GI standpoint.  He does have what sounds to be like symptomatic hemorrhoids with rectal itching and irritation.  He has generally soft loose stools ranging from typical [place] 5-6.  No increase in frequency of his stools since it become softer.  Query med effect.  No other overt GI complaints.  We will proceed with colonoscopy at this time.  Proceed with TCS on propofol/MAC with Dr. Abbey Chatters on propofol/MAC in near future: the risks, benefits, and alternatives have been discussed with the patient in detail. The patient states understanding and desires to proceed.  ASA II   Plan: 1. Colonoscopy as described above 2. Follow-up based on post procedure recommendations or as needed for GI symptoms    Thank you for allowing [place] to participate in the care of [place], DNP, AGNP-C Adult & Gerontological Nurse Practitioner [facility]   03/23/2021 11:17 AM   Disclaimer: This note was dictated with voice recognition software. Similar sounding words can inadvertently be transcribed and  may not be corrected upon review.

## 2021-03-23 NOTE — Patient Instructions (Addendum)
Your health issues we discussed today were:   Need for a colonoscopy: 1. We will schedule your colonoscopy for you 2. Further recommendations to follow your colonoscopy 3. Call us if you have any questions or problems about the bowel prep  Overall I recommend:  1. Continue other current medications 2. Return for follow-up based on recommendations made after your procedure, or as needed for any GI symptoms 3. Call us if you have questions or concerns   ---------------------------------------------------------------  I am glad you have gotten your COVID-19 vaccination!  Even though you are fully vaccinated you should continue to follow CDC and state/local guidelines.  ---------------------------------------------------------------   At [facility] we value your feedback. You may receive a survey about your visit today. Please share your experience as we strive to create trusting relationships with our patients to provide genuine, compassionate, quality care.  We appreciate your understanding and patience as we review any laboratory studies, imaging, and other diagnostic tests that are ordered as we care for you. Our office policy is 5 business days for review of these results, and any emergent or urgent results are addressed in a timely manner for your best interest. If you do not hear from our office in 1 week, please contact us.   We also encourage the use of MyChart, which contains your medical information for your review as well. If you are not enrolled in this feature, an access code is on this after visit summary for your convenience. Thank you for allowing [place] to be involved in your care.  It was great to see you today!  I hope you have a great spring and happy Easter!!

## 2021-03-28 DIAGNOSIS — G894 Chronic pain syndrome: Secondary | ICD-10-CM | POA: Diagnosis not present

## 2021-03-28 DIAGNOSIS — E538 Deficiency of other specified B group vitamins: Secondary | ICD-10-CM | POA: Diagnosis not present

## 2021-03-28 DIAGNOSIS — Z6827 Body mass index (BMI) 27.0-27.9, adult: Secondary | ICD-10-CM | POA: Diagnosis not present

## 2021-04-06 ENCOUNTER — Telehealth: Payer: Self-pay | Admitting: *Deleted

## 2021-04-06 NOTE — Telephone Encounter (Signed)
Received call from endo unable to reach patient regarding arrival time for procedure on Monday. He needs to arrive 11:45am.  Called pt and spoke with spouse. She is aware of arrival time

## 2021-04-07 ENCOUNTER — Other Ambulatory Visit (HOSPITAL_COMMUNITY)
Admission: RE | Admit: 2021-04-07 | Discharge: 2021-04-07 | Disposition: A | Discharge status: Home / Self Care | Payer: Medicare HMO | Source: Ambulatory Visit | Attending: Internal Medicine | Admitting: Internal Medicine

## 2021-04-07 ENCOUNTER — Other Ambulatory Visit: Payer: Self-pay

## 2021-04-07 DIAGNOSIS — Z01812 Encounter for preprocedural laboratory examination: Secondary | ICD-10-CM | POA: Insufficient documentation

## 2021-04-07 DIAGNOSIS — Z20822 Contact with and (suspected) exposure to covid-19: Secondary | ICD-10-CM | POA: Insufficient documentation

## 2021-04-08 DIAGNOSIS — I129 Hypertensive chronic kidney disease with stage 1 through stage 4 chronic kidney disease, or unspecified chronic kidney disease: Secondary | ICD-10-CM | POA: Diagnosis not present

## 2021-04-08 DIAGNOSIS — E1122 Type 2 diabetes mellitus with diabetic chronic kidney disease: Secondary | ICD-10-CM | POA: Diagnosis not present

## 2021-04-08 DIAGNOSIS — E7849 Other hyperlipidemia: Secondary | ICD-10-CM | POA: Diagnosis not present

## 2021-04-08 DIAGNOSIS — N1832 Chronic kidney disease, stage 3b: Secondary | ICD-10-CM | POA: Diagnosis not present

## 2021-04-08 LAB — SARS CORONAVIRUS 2 (TAT 6-24 HRS): SARS Coronavirus 2: NEGATIVE

## 2021-04-10 ENCOUNTER — Encounter (HOSPITAL_COMMUNITY)
Admission: RE | Disposition: A | Discharge status: Home / Self Care | Payer: Self-pay | Source: Home / Self Care | Attending: Internal Medicine

## 2021-04-10 ENCOUNTER — Ambulatory Visit (HOSPITAL_COMMUNITY)
Admission: RE | Admit: 2021-04-10 | Discharge: 2021-04-10 | Disposition: A | Discharge status: Home / Self Care | Payer: Medicare HMO | Attending: Internal Medicine | Admitting: Internal Medicine

## 2021-04-10 ENCOUNTER — Ambulatory Visit (HOSPITAL_COMMUNITY): Payer: Medicare HMO | Admitting: Anesthesiology

## 2021-04-10 ENCOUNTER — Encounter (HOSPITAL_COMMUNITY): Payer: Self-pay

## 2021-04-10 ENCOUNTER — Other Ambulatory Visit: Payer: Self-pay

## 2021-04-10 DIAGNOSIS — G4730 Sleep apnea, unspecified: Secondary | ICD-10-CM | POA: Diagnosis not present

## 2021-04-10 DIAGNOSIS — D123 Benign neoplasm of transverse colon: Secondary | ICD-10-CM | POA: Diagnosis not present

## 2021-04-10 DIAGNOSIS — R195 Other fecal abnormalities: Secondary | ICD-10-CM | POA: Insufficient documentation

## 2021-04-10 DIAGNOSIS — G4733 Obstructive sleep apnea (adult) (pediatric): Secondary | ICD-10-CM | POA: Diagnosis not present

## 2021-04-10 DIAGNOSIS — K648 Other hemorrhoids: Secondary | ICD-10-CM | POA: Insufficient documentation

## 2021-04-10 DIAGNOSIS — D127 Benign neoplasm of rectosigmoid junction: Secondary | ICD-10-CM | POA: Diagnosis not present

## 2021-04-10 DIAGNOSIS — D122 Benign neoplasm of ascending colon: Secondary | ICD-10-CM | POA: Diagnosis not present

## 2021-04-10 DIAGNOSIS — Z800 Family history of malignant neoplasm of digestive organs: Secondary | ICD-10-CM | POA: Insufficient documentation

## 2021-04-10 DIAGNOSIS — R7303 Prediabetes: Secondary | ICD-10-CM | POA: Diagnosis not present

## 2021-04-10 DIAGNOSIS — Z87442 Personal history of urinary calculi: Secondary | ICD-10-CM | POA: Diagnosis not present

## 2021-04-10 DIAGNOSIS — I10 Essential (primary) hypertension: Secondary | ICD-10-CM | POA: Insufficient documentation

## 2021-04-10 DIAGNOSIS — Z79899 Other long term (current) drug therapy: Secondary | ICD-10-CM | POA: Insufficient documentation

## 2021-04-10 DIAGNOSIS — D125 Benign neoplasm of sigmoid colon: Secondary | ICD-10-CM | POA: Diagnosis not present

## 2021-04-10 DIAGNOSIS — K635 Polyp of colon: Secondary | ICD-10-CM

## 2021-04-10 HISTORY — PX: COLONOSCOPY WITH PROPOFOL: SHX5780

## 2021-04-10 HISTORY — PX: POLYPECTOMY: SHX5525

## 2021-04-10 SURGERY — COLONOSCOPY WITH PROPOFOL
Anesthesia: General

## 2021-04-10 MED ORDER — PHENYLEPHRINE HCL (PRESSORS) 10 MG/ML IV SOLN
INTRAVENOUS | Status: DC | PRN
  Administered 2021-04-10: 80 ug via INTRAVENOUS

## 2021-04-10 MED ORDER — LACTATED RINGERS IV SOLN: INTRAVENOUS | Status: DC

## 2021-04-10 MED ORDER — PROPOFOL 10 MG/ML IV BOLUS
INTRAVENOUS | Status: DC | PRN
  Administered 2021-04-10: 100 mg via INTRAVENOUS

## 2021-04-10 MED ORDER — PROPOFOL 500 MG/50ML IV EMUL
INTRAVENOUS | Status: DC | PRN
  Administered 2021-04-10: 150 ug/kg/min via INTRAVENOUS

## 2021-04-10 NOTE — Interval H&P Note (Signed)
History and Physical Interval Note:  04/10/2021 11:42 AM  Trevor Ayala  has presented today for surgery, with the diagnosis of positive cologuard.  The various methods of treatment have been discussed with the patient and family. After consideration of risks, benefits and other options for treatment, the patient has consented to  Procedure(s) with comments: COLONOSCOPY WITH PROPOFOL (N/A) - PM as a surgical intervention.  The patient's history has been reviewed, patient examined, no change in status, stable for surgery.  I have reviewed the patient's chart and labs.  Questions were answered to the patient's satisfaction.     Eloise Harman

## 2021-04-10 NOTE — Anesthesia Preprocedure Evaluation (Addendum)
Anesthesia Evaluation  Patient identified by MRN, date of birth, ID band Patient awake    Reviewed: Allergy & Precautions, NPO status , Patient's Chart, lab work & pertinent test results  Airway Mallampati: II  TM Distance: >3 FB Neck ROM: Full    Dental  (+) Dental Advisory Given, Partial Upper   Pulmonary sleep apnea ,    Pulmonary exam normal breath sounds clear to auscultation       Cardiovascular Exercise Tolerance: Good hypertension, Pt. on medications Normal cardiovascular exam Rhythm:Regular Rate:Normal     Neuro/Psych negative neurological ROS  negative psych ROS   GI/Hepatic negative GI ROS, Neg liver ROS,   Endo/Other  negative endocrine ROS  Renal/GU Renal InsufficiencyRenal disease     Musculoskeletal   Abdominal   Peds  Hematology negative hematology ROS (+)   Anesthesia Other Findings   Reproductive/Obstetrics                            Anesthesia Physical Anesthesia Plan  ASA: II  Anesthesia Plan: General   Post-op Pain Management:    Induction: Intravenous  PONV Risk Score and Plan: Propofol infusion  Airway Management Planned: Nasal Cannula and Natural Airway  Additional Equipment:   Intra-op Plan:   Post-operative Plan:   Informed Consent: I have reviewed the patients History and Physical, chart, labs and discussed the procedure including the risks, benefits and alternatives for the proposed anesthesia with the patient or authorized representative who has indicated his/her understanding and acceptance.       Plan Discussed with: CRNA and Surgeon  Anesthesia Plan Comments:        Anesthesia Quick Evaluation

## 2021-04-10 NOTE — Op Note (Signed)
[facility] Patient Name: Trevor Ayala Procedure Date: 04/10/2021 12:21 PM MRN: [id number] Date of Birth: October 04, 1951 Attending MD: Elon Alas. Abbey Chatters DO CSN: [id number] Age: 70 Admit Type: Outpatient Procedure:                Colonoscopy Indications:              Positive Cologuard test Providers:                Elon Alas. Abbey Chatters, DO, Gwenlyn Fudge, RN, Aram Candela Referring MD:              Medicines:                See the Anesthesia note for documentation of the                            administered medications Complications:            No immediate complications. Estimated Blood Loss:     Estimated blood loss was minimal. Procedure:                Pre-Anesthesia Assessment:                           - The anesthesia plan was to use monitored                            anesthesia care (MAC).                           After obtaining informed consent, the colonoscope                            was passed under direct vision. Throughout the                            procedure, the patient's blood pressure, pulse, and                            oxygen saturations were monitored continuously. The                            [id number]-[id number] ([id number]) scope was introduced through                            the anus and advanced to the the cecum, identified                            by appendiceal orifice and ileocecal valve. The                            colonoscopy was performed without difficulty. The                            patient tolerated the procedure well. The quality  of the bowel preparation was evaluated using the                            BBPS [facility] Bowel Preparation Scale) with scores                            of: Right Colon = 3, Transverse Colon = 3 and Left                            Colon = 3 (entire mucosa seen well with no residual                            staining, small fragments of stool or opaque                             liquid). The total BBPS score equals 9. Scope In: 12:52:33 PM Scope Out: 1:12:04 PM Scope Withdrawal Time: 0 hours 16 minutes 27 seconds  Total Procedure Duration: 0 hours 19 minutes 31 seconds  Findings:      The perianal and digital rectal examinations were normal.      Non-bleeding internal hemorrhoids were found during endoscopy.      Two sessile polyps were found in the transverse colon and ascending       colon. The polyps were 4 to 7 mm in size. These polyps were removed with       a cold snare. Resection and retrieval were complete.      Three sessile polyps were found in the descending colon. The polyps were       5 to 8 mm in size. These polyps were removed with a cold snare.       Resection and retrieval were complete.      A 12 mm polyp was found in the recto-sigmoid colon. The polyp was       pedunculated. The polyp was removed with a cold snare. Resection and       retrieval were complete. To prevent bleeding after the polypectomy, one       hemostatic clip was successfully placed (MR conditional). There was no       bleeding at the end of the procedure. Impression:               - Non-bleeding internal hemorrhoids.                           - Two 4 to 7 mm polyps in the transverse colon and                            in the ascending colon, removed with a cold snare.                            Resected and retrieved.                           - Three 5 to 8 mm polyps in the descending colon,  removed with a cold snare. Resected and retrieved.                           - One 12 mm polyp at the recto-sigmoid colon,                            removed with a cold snare. Resected and retrieved. Moderate Sedation:      Per Anesthesia Care Recommendation:           - Patient has a contact number available for                            emergencies. The signs and symptoms of potential                            delayed  complications were discussed with the                            patient. Return to normal activities tomorrow.                            Written discharge instructions were provided to the                            patient.                           - Resume previous diet.                           - Continue present medications.                           - Await pathology results.                           - Repeat colonoscopy in 3 years for surveillance.                           - Return to GI clinic PRN.                           - Needs KUB prior to any MRI in the next 60 days Procedure Code(s):        --- Professional ---                           [phone], Colonoscopy, flexible; with removal of                            tumor(s), polyp(s), or other lesion(s) by snare                            technique Diagnosis Code(s):        --- Professional ---  K63.5, Polyp of colon                           K64.8, Other hemorrhoids                           R19.5, Other fecal abnormalities CPT copyright 2019 American Medical Association. All rights reserved. The codes documented in this report are preliminary and upon coder review may  be revised to meet current compliance requirements. Elon Alas. Abbey Chatters, DO [name], DO 04/10/2021 1:16:48 PM This report has been signed electronically. Number of Addenda: 0

## 2021-04-10 NOTE — Discharge Instructions (Addendum)
Colonoscopy Discharge Instructions  Read the instructions outlined below and refer to this sheet in the next few weeks. These discharge instructions provide you with general information on caring for yourself after you leave the hospital. Your doctor may also give you specific instructions. While your treatment has been planned according to the most current medical practices available, unavoidable complications occasionally occur.   ACTIVITY  You may resume your regular activity, but move at a slower pace for the next 24 hours.   Take frequent rest periods for the next 24 hours.   Walking will help get rid of the air and reduce the bloated feeling in your belly (abdomen).   No driving for 24 hours (because of the medicine (anesthesia) used during the test).    Do not sign any important legal documents or operate any machinery for 24 hours (because of the anesthesia used during the test).  NUTRITION  Drink plenty of fluids.   You may resume your normal diet as instructed by your doctor.   Begin with a light meal and progress to your normal diet. Heavy or fried foods are harder to digest and may make you feel sick to your stomach (nauseated).   Avoid alcoholic beverages for 24 hours or as instructed.  MEDICATIONS  You may resume your normal medications unless your doctor tells you otherwise.  WHAT YOU CAN EXPECT TODAY  Some feelings of bloating in the abdomen.   Passage of more gas than usual.   Spotting of blood in your stool or on the toilet paper.  IF YOU HAD POLYPS REMOVED DURING THE COLONOSCOPY:  No aspirin products for 7 days or as instructed.   No alcohol for 7 days or as instructed.   Eat a soft diet for the next 24 hours.  FINDING OUT THE RESULTS OF YOUR TEST Not all test results are available during your visit. If your test results are not back during the visit, make an appointment with your caregiver to find out the results. Do not assume everything is normal if  you have not heard from your caregiver or the medical facility. It is important for you to follow up on all of your test results.  SEEK IMMEDIATE MEDICAL ATTENTION IF:  You have more than a spotting of blood in your stool.   Your belly is swollen (abdominal distention).   You are nauseated or vomiting.   You have a temperature over 101.   You have abdominal pain or discomfort that is severe or gets worse throughout the day.   Your colonoscopy revealed 6 polyp(s) which I removed successfully. Await pathology results, my office will contact you. I recommend repeating colonoscopy in 3 years for surveillance purposes. Otherwise, follow up with GI as needed.   I hope you have a great rest of your week!  Elon Alas. Abbey Chatters, D.O. Gastroenterology and Hepatology [facility]   Colon Polyps  Colon polyps are tissue growths inside the colon, which is part of the large intestine. They are one of the types of polyps that can grow in the body. A polyp may be a round bump or a mushroom-shaped growth. You could have one polyp or more than one. Most colon polyps are noncancerous (benign). However, some colon polyps can become cancerous over time. Finding and removing the polyps early can help prevent this. What are the causes? The exact cause of colon polyps is not known. What increases the risk? The following factors may make you more likely to develop  this condition:  Having a family history of colorectal cancer or colon polyps.  Being older than 70 years of age.  Being younger than 70 years of age and having a significant family history of colorectal cancer or colon polyps or a genetic condition that puts you at higher risk of getting colon polyps.  Having inflammatory bowel disease, such as ulcerative colitis or Crohn's disease.  Having certain conditions passed from parent to child (hereditary conditions), such as: ? Familial adenomatous polyposis  (FAP). ? Lynch syndrome. ? Turcot syndrome. ? Peutz-Jeghers syndrome. ? MUTYH-associated polyposis (MAP).  Being overweight.  Certain lifestyle factors. These include smoking cigarettes, drinking too much alcohol, not getting enough exercise, and eating a diet that is high in fat and red meat and low in fiber.  Having had childhood cancer that was treated with radiation of the abdomen. What are the signs or symptoms? Many times, there are no symptoms. If you have symptoms, they may include:  Blood coming from the rectum during a bowel movement.  Blood in the stool (feces). The blood may be bright red or very dark in color.  Pain in the abdomen.  A change in bowel habits, such as constipation or diarrhea. How is this diagnosed? This condition is diagnosed with a colonoscopy. This is a procedure in which a lighted, flexible scope is inserted into the opening between the buttocks (anus) and then passed into the colon to examine the area. Polyps are sometimes found when a colonoscopy is done as part of routine cancer screening tests. How is this treated? This condition is treated by removing any polyps that are found. Most polyps can be removed during a colonoscopy. Those polyps will then be tested for cancer. Additional treatment may be needed depending on the results of testing. Follow these instructions at home: Eating and drinking  Eat foods that are high in fiber, such as fruits, vegetables, and whole grains.  Eat foods that are high in calcium and vitamin D, such as milk, cheese, yogurt, eggs, liver, fish, and broccoli.  Limit foods that are high in fat, such as fried foods and desserts.  Limit the amount of red meat, precooked or cured meat, or other processed meat that you eat, such as hot dogs, sausages, bacon, or meat loaves.  Limit sugary drinks.   Lifestyle  Maintain a healthy weight, or lose weight if recommended by your health care provider.  Exercise every day or  as told by your health care provider.  Do not use any products that contain nicotine or tobacco, such as cigarettes, e-cigarettes, and chewing tobacco. If you need help quitting, ask your health care provider.  Do not drink alcohol if: ? Your health care provider tells you not to drink. ? You are pregnant, may be pregnant, or are planning to become pregnant.  If you drink alcohol: ? Limit how much you use to:  0-1 drink a day for women.  0-2 drinks a day for men. ? Know how much alcohol is in your drink. In the [place] one drink equals one 12 oz bottle of beer (355 mL), one 5 oz glass of wine (148 mL), or one 1 oz glass of hard liquor (44 mL). General instructions  Take over-the-counter and prescription medicines only as told by your health care provider.  Keep all follow-up visits. This is important. This includes having regularly scheduled colonoscopies. Talk to your health care provider about when you need a colonoscopy. Contact a health care provider if:  You have new or worsening bleeding during a bowel movement.  You have new or increased blood in your stool.  You have a change in bowel habits.  You lose weight for no known reason. Summary  Colon polyps are tissue growths inside the colon, which is part of the large intestine. They are one type of polyp that can grow in the body.  Most colon polyps are noncancerous (benign), but some can become cancerous over time.  This condition is diagnosed with a colonoscopy.  This condition is treated by removing any polyps that are found. Most polyps can be removed during a colonoscopy. This information is not intended to replace advice given to you by your health care provider. Make sure you discuss any questions you have with your health care provider. Document Revised: 03/16/2020 Document Reviewed: 03/16/2020 Elsevier Patient Education  2021 [employer].

## 2021-04-10 NOTE — Transfer of Care (Signed)
Immediate Anesthesia Transfer of Care Note  Patient: Trevor Ayala  Procedure(s) Performed: COLONOSCOPY WITH PROPOFOL (N/A ) POLYPECTOMY  Patient Location: PACU and Endoscopy Unit  Anesthesia Type:MAC  Level of Consciousness: awake, alert , oriented and patient cooperative  Airway & Oxygen Therapy: Patient Spontanous Breathing and Patient connected to nasal cannula oxygen  Post-op Assessment: Report given to RN, Post -op Vital signs reviewed and stable and Patient moving all extremities  Post vital signs: Reviewed and stable  Last Vitals:  Vitals Value Taken Time  BP    Temp    Pulse    Resp    SpO2      Last Pain:  Vitals:   04/10/21 1247  TempSrc:   PainSc: 0-No pain      Patients Stated Pain Goal: 8 (69/45/03 8882)  Complications: No complications documented.

## 2021-04-10 NOTE — Anesthesia Postprocedure Evaluation (Signed)
Anesthesia Post Note  Patient: Trevor Ayala  Procedure(s) Performed: COLONOSCOPY WITH PROPOFOL (N/A ) POLYPECTOMY  Patient location during evaluation: Endoscopy Anesthesia Type: General Level of consciousness: awake and alert and oriented Pain management: pain level controlled Vital Signs Assessment: post-procedure vital signs reviewed and stable Respiratory status: spontaneous breathing and respiratory function stable Cardiovascular status: blood pressure returned to baseline and stable Postop Assessment: no apparent nausea or vomiting Anesthetic complications: no   No complications documented.   Last Vitals:  Vitals:   04/10/21 1200 04/10/21 1316  BP: (!) 118/102 (!) 116/98  Pulse: 78   Resp: 16 (!) 26  Temp: 36.7 C 36.7 C  SpO2: 99% 96%    Last Pain:  Vitals:   04/10/21 1316  TempSrc: Oral  PainSc: 0-No pain                 Trevor Ayala

## 2021-04-10 NOTE — Addendum Note (Signed)
Addendum  created 04/10/21 1426 by Orlie Dakin, CRNA   Charge Capture section accepted

## 2021-04-12 LAB — SURGICAL PATHOLOGY

## 2021-04-13 ENCOUNTER — Ambulatory Visit: Payer: Medicare HMO | Admitting: Cardiology

## 2021-04-13 ENCOUNTER — Other Ambulatory Visit: Payer: Self-pay

## 2021-04-13 ENCOUNTER — Encounter: Payer: Self-pay | Admitting: Cardiology

## 2021-04-13 VITALS — BP 178/86 | HR 58 | Ht 68.0 in | Wt 177.8 lb

## 2021-04-13 DIAGNOSIS — I10 Essential (primary) hypertension: Secondary | ICD-10-CM

## 2021-04-13 DIAGNOSIS — I493 Ventricular premature depolarization: Secondary | ICD-10-CM

## 2021-04-13 NOTE — Patient Instructions (Signed)

## 2021-04-13 NOTE — Progress Notes (Signed)
Clinical Summary Mr. Gerke is a 70 y.o.male seen today for follow up of the following medical problems.   1. Irregular heart beat/PVCs - completed holter monitor which showed fairly frequent PVCs (approx 12000), 2 short runs of NSVT. - echo and stress competed without significant abnormalities.     - denies any palpitaitons.   2. HTN -he is compliant with meds - historically has some component of white coat HTN, though bp's tend to run ok at pcp visits and elevated at our visits. From GI visit last month he was 140/90   SH: retired 4 years ago. Has custody of 9 year old granddaughter. 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    Past Medical History:  Diagnosis Date  . Asymptomatic PVCs   . History of kidney stones   . Hypertension   . 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 Pre-diabetes    was dx when at over 200lbs ; reports no meds or CBG checks since losing weight   . PVC (premature ventricular contraction)    a. 2015: echo showing EF of 60-65%, no WMA, Grade 1 DD, and mild MR. NST low-risk.   . Renal calculus, left   . Right ureteral stone   . Wears glasses      No Known Allergies   Current Outpatient Medications  Medication Sig Dispense Refill  . amLODipine (NORVASC) 10 MG tablet Take 10 mg by mouth in the morning.    . cyanocobalamin (,VITAMIN B-12,) 1000 MCG/ML injection Inject 1,000 mcg into the muscle once.    . Hydrocortisone, Perianal, 1 % CREA Apply 1 application topically 2 (two) times daily as needed (hemmorroids/rectal discomfort).    Marland Kitchen HYDROMET 5-1.5 MG/5ML syrup Take 5 mLs by mouth 4 (four) times daily as needed for cough.    Marland Kitchen lisinopril (ZESTRIL) 10 MG tablet Take 10 mg by mouth at bedtime.    Marland Kitchen oxyCODONE-acetaminophen (PERCOCET) 10-325 MG tablet Take 1 tablet by mouth every 6 (six) hours as needed (sciatica pain).    . polyethylene glycol-electrolytes  (TRILYTE) 420 g solution Take 4,000 mLs by mouth as directed. 4000 mL 0  . zolpidem (AMBIEN) 10 MG tablet Take 10 mg by mouth at bedtime as needed for sleep.     No current facility-administered medications for this visit.     Past Surgical History:  Procedure Laterality Date  . CYSTOSCOPY W/ RETROGRADES Left 10/16/2017   Procedure: CYSTOSCOPY WITH URETEROSCOPY AND  RETROGRADE PYELOGRAM;  Surgeon: Alexis Frock, MD;  Location: [facility];  Service: Urology;  Laterality: Left;  . CYSTOSCOPY WITH RETROGRADE PYELOGRAM, URETEROSCOPY AND STENT PLACEMENT Right 10/06/2014   Procedure: CYSTOSCOPY WITH RETROGRADE PYELOGRAM, URETEROSCOPY AND STENT PLACEMENT;  Surgeon: Alexis Frock, MD;  Location: [facility];  Service: Urology;  Laterality: Right;  . HOLMIUM LASER APPLICATION Right 19/14/7829   Procedure: HOLMIUM LASER APPLICATION;  Surgeon: Alexis Frock, MD;  Location[facility];  Service: Urology;  Laterality: Right;  . HOLMIUM LASER APPLICATION Left 56/01/1307   Procedure: HOLMIUM LASER APPLICATION;  Surgeon: Alexis Frock, MD;  Location: [facility];  Service: Urology;  Laterality: Left;  . NEPHROLITHOTOMY Left 10/16/2017   Procedure: NEPHROLITHOTOMY PERCUTANEOUS WITH SURGEON ACCESS;  Surgeon: Alexis Frock, MD;  Location: [facility];  Service: Urology;  Laterality: Left;  . TRANSTHORACIC ECHOCARDIOGRAM  09-09-2014   mild LVH/  ef 65-78%/  grade I diastolic  dysfunction/  mild MR/  mild sclerotic Av     No Known Allergies    Family History  Problem Relation Age of Onset  . Cancer Mother   . Cancer Brother   . Colon cancer Neg Hx      Social History Mr. Carrizales reports that he has never smoked. He has never used smokeless tobacco. Mr. Nifong reports no history of alcohol use.   Review of Systems CONSTITUTIONAL: No weight loss, fever, chills, weakness or fatigue.  HEENT: Eyes: No visual loss, blurred vision, double vision or yellow sclerae.No hearing loss,  sneezing, congestion, runny nose or sore throat.  SKIN: No rash or itching.  CARDIOVASCULAR: per hpi RESPIRATORY: No shortness of breath, cough or sputum.  GASTROINTESTINAL: No anorexia, nausea, vomiting or diarrhea. No abdominal pain or blood.  GENITOURINARY: No burning on urination, no polyuria NEUROLOGICAL: No headache, dizziness, syncope, paralysis, ataxia, numbness or tingling in the extremities. No change in bowel or bladder control.  MUSCULOSKELETAL: No muscle, back pain, joint pain or stiffness.  LYMPHATICS: No enlarged nodes. No history of splenectomy.  PSYCHIATRIC: No history of depression or anxiety.  ENDOCRINOLOGIC: No reports of sweating, cold or heat intolerance. No polyuria or polydipsia.  Marland Kitchen   Physical Examination Today's Vitals   04/13/21 0910  BP: (!) 178/86  Pulse: (!) 58  SpO2: 95%  Weight: 177 lb 12.8 oz (80.6 kg)  Height: _0  (1.727 m)   Body mass index is 27.03 kg/m.  Gen: resting comfortably, no acute distress HEENT: no scleral icterus, pupils equal round and reactive, no palptable cervical adenopathy,  CV: RRR, no m/r/g no jvd Resp: Clear to auscultation bilaterally GI: abdomen is soft, non-tender, non-distended, normal bowel sounds, no hepatosplenomegaly MSK: extremities are warm, no edema.  Skin: warm, no rash Neuro:  no focal deficits Psych: appropriate affect   Diagnostic Studies  09/2014 Echo Study Conclusions  - Left ventricle: The cavity size was normal. Wall thickness was increased in a pattern of mild LVH. Systolic function was normal. The estimated ejection fraction was in the range of 60% to 65%. Wall motion was normal; there were no regional wall motion abnormalities. Doppler parameters are consistent with abnormal left ventricular relaxation (grade 1 diastolic dysfunction). Doppler parameters are consistent with elevated ventricular end-diastolic filling pressure. - Aortic valve: Mildly calcified annulus.  Trileaflet; mildly calcified leaflets. - Mitral valve: Calcified annulus. There was mild regurgitation. - Right atrium: Central venous pressure (est): 3 mm Hg. - Tricuspid valve: There was trivial regurgitation. - Pulmonary arteries: Systolic pressure could not be accurately estimated. - Pericardium, extracardiac: There was no pericardial effusion.  Impressions:  - Mild LVH with LVEF 09-32%, grade 1 diastolic dysfunction with increased filling pressures. MAC with mild mitral regurgitation. Mildly sclerotic aortic valve. Trivial tricuspid regurgitation, unable to assess PASP.  10/2014 MPI IMPRESSION: 1. No reversible ischemia or infarction.  2. Normal left ventricular wall motion.  3. Left ventricular ejection fraction 64%  4. Low-risk stress test findings*.  08/2014 Holter Frequent PVCs, 2 runs NSVT 6 and 5 beats     Assessment and Plan     1. Irregular heart beat/PVCs -several year history, asymptomatic - negative workup for underlying heart disease with echo and stress test - no recent symptoms, continue to monitor - EKG today shows NSR  2. HTN -repeat manual check 140/80. Tends to run high in our clinic, he reports at pcp visits he is routinely at goal,. Will request last pcp notes, continue current meds  Arnoldo Lenis, M.D.

## 2021-04-14 ENCOUNTER — Encounter (HOSPITAL_COMMUNITY): Payer: Self-pay | Admitting: Internal Medicine

## 2021-04-18 NOTE — Progress Notes (Signed)
Dear Mr. Koppel,   The polyp(s) removed during your colonoscopy were tubular adenoma(s). We will need to repeat your next colonoscopy in 3 years as discussed postoperatively.   Please continue to follow-up with GI as needed. If you have any questions/concerns please call us at [phone].     Thank you, Oleh Genin, CMA

## 2021-04-25 DIAGNOSIS — R201 Hypoesthesia of skin: Secondary | ICD-10-CM | POA: Diagnosis not present

## 2021-04-25 DIAGNOSIS — I10 Essential (primary) hypertension: Secondary | ICD-10-CM | POA: Diagnosis not present

## 2021-04-25 DIAGNOSIS — G894 Chronic pain syndrome: Secondary | ICD-10-CM | POA: Diagnosis not present

## 2021-04-25 DIAGNOSIS — E1140 Type 2 diabetes mellitus with diabetic neuropathy, unspecified: Secondary | ICD-10-CM | POA: Diagnosis not present

## 2021-04-25 DIAGNOSIS — Z681 Body mass index (BMI) 19 or less, adult: Secondary | ICD-10-CM | POA: Diagnosis not present

## 2021-05-15 DIAGNOSIS — M5136 Other intervertebral disc degeneration, lumbar region: Secondary | ICD-10-CM | POA: Diagnosis not present

## 2021-05-15 DIAGNOSIS — E663 Overweight: Secondary | ICD-10-CM | POA: Diagnosis not present

## 2021-05-15 DIAGNOSIS — Z6826 Body mass index (BMI) 26.0-26.9, adult: Secondary | ICD-10-CM | POA: Diagnosis not present

## 2021-05-15 DIAGNOSIS — G4733 Obstructive sleep apnea (adult) (pediatric): Secondary | ICD-10-CM | POA: Diagnosis not present

## 2021-05-15 DIAGNOSIS — G894 Chronic pain syndrome: Secondary | ICD-10-CM | POA: Diagnosis not present

## 2021-05-15 DIAGNOSIS — E1129 Type 2 diabetes mellitus with other diabetic kidney complication: Secondary | ICD-10-CM | POA: Diagnosis not present

## 2021-05-29 DIAGNOSIS — G4733 Obstructive sleep apnea (adult) (pediatric): Secondary | ICD-10-CM | POA: Diagnosis not present

## 2021-05-29 DIAGNOSIS — E663 Overweight: Secondary | ICD-10-CM | POA: Diagnosis not present

## 2021-05-29 DIAGNOSIS — Z6826 Body mass index (BMI) 26.0-26.9, adult: Secondary | ICD-10-CM | POA: Diagnosis not present

## 2021-05-29 DIAGNOSIS — M5136 Other intervertebral disc degeneration, lumbar region: Secondary | ICD-10-CM | POA: Diagnosis not present

## 2021-05-29 DIAGNOSIS — E1129 Type 2 diabetes mellitus with other diabetic kidney complication: Secondary | ICD-10-CM | POA: Diagnosis not present

## 2021-05-29 DIAGNOSIS — G894 Chronic pain syndrome: Secondary | ICD-10-CM | POA: Diagnosis not present

## 2021-06-20 DIAGNOSIS — E538 Deficiency of other specified B group vitamins: Secondary | ICD-10-CM | POA: Diagnosis not present

## 2021-06-20 DIAGNOSIS — E663 Overweight: Secondary | ICD-10-CM | POA: Diagnosis not present

## 2021-06-20 DIAGNOSIS — I10 Essential (primary) hypertension: Secondary | ICD-10-CM | POA: Diagnosis not present

## 2021-06-20 DIAGNOSIS — G8929 Other chronic pain: Secondary | ICD-10-CM | POA: Diagnosis not present

## 2021-06-20 DIAGNOSIS — Z6826 Body mass index (BMI) 26.0-26.9, adult: Secondary | ICD-10-CM | POA: Diagnosis not present

## 2021-06-29 ENCOUNTER — Telehealth: Payer: Self-pay | Admitting: Cardiology

## 2021-06-29 NOTE — Telephone Encounter (Signed)
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 He needs letter by 06/30/21 so that he can drive to [place]

## 2021-06-30 NOTE — Telephone Encounter (Signed)
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 He said if Dr Harl Bowie gets the chance can he email it to him at [email or web address] or he can come back back before 5pm and pick it up.

## 2021-07-03 NOTE — Telephone Encounter (Signed)
Letter printed, awaiting pickup from pt.

## 2021-07-03 NOTE — Telephone Encounter (Signed)
Can we provide the following in a letter to the patient   To whom it may concern, Trevor Ayala is followed in [facility] cardiology clinic. 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   Carlyle Dolly MD

## 2021-07-19 DIAGNOSIS — I10 Essential (primary) hypertension: Secondary | ICD-10-CM | POA: Diagnosis not present

## 2021-07-19 DIAGNOSIS — Z6825 Body mass index (BMI) 25.0-25.9, adult: Secondary | ICD-10-CM | POA: Diagnosis not present

## 2021-07-19 DIAGNOSIS — M47816 Spondylosis without myelopathy or radiculopathy, lumbar region: Secondary | ICD-10-CM | POA: Diagnosis not present

## 2021-07-19 DIAGNOSIS — E663 Overweight: Secondary | ICD-10-CM | POA: Diagnosis not present

## 2021-07-19 DIAGNOSIS — G894 Chronic pain syndrome: Secondary | ICD-10-CM | POA: Diagnosis not present

## 2021-07-19 DIAGNOSIS — E1129 Type 2 diabetes mellitus with other diabetic kidney complication: Secondary | ICD-10-CM | POA: Diagnosis not present

## 2021-08-09 DIAGNOSIS — N1832 Chronic kidney disease, stage 3b: Secondary | ICD-10-CM | POA: Diagnosis not present

## 2021-08-09 DIAGNOSIS — I129 Hypertensive chronic kidney disease with stage 1 through stage 4 chronic kidney disease, or unspecified chronic kidney disease: Secondary | ICD-10-CM | POA: Diagnosis not present

## 2021-08-09 DIAGNOSIS — E1122 Type 2 diabetes mellitus with diabetic chronic kidney disease: Secondary | ICD-10-CM | POA: Diagnosis not present

## 2021-08-09 DIAGNOSIS — E7849 Other hyperlipidemia: Secondary | ICD-10-CM | POA: Diagnosis not present

## 2021-08-18 DIAGNOSIS — G894 Chronic pain syndrome: Secondary | ICD-10-CM | POA: Diagnosis not present

## 2021-08-18 DIAGNOSIS — G4700 Insomnia, unspecified: Secondary | ICD-10-CM | POA: Diagnosis not present

## 2021-09-19 DIAGNOSIS — I10 Essential (primary) hypertension: Secondary | ICD-10-CM | POA: Diagnosis not present

## 2021-09-19 DIAGNOSIS — E663 Overweight: Secondary | ICD-10-CM | POA: Diagnosis not present

## 2021-09-19 DIAGNOSIS — E1140 Type 2 diabetes mellitus with diabetic neuropathy, unspecified: Secondary | ICD-10-CM | POA: Diagnosis not present

## 2021-09-19 DIAGNOSIS — G894 Chronic pain syndrome: Secondary | ICD-10-CM | POA: Diagnosis not present

## 2021-09-19 DIAGNOSIS — Z6824 Body mass index (BMI) 24.0-24.9, adult: Secondary | ICD-10-CM | POA: Diagnosis not present

## 2021-10-17 DIAGNOSIS — G8929 Other chronic pain: Secondary | ICD-10-CM | POA: Diagnosis not present

## 2021-10-17 DIAGNOSIS — M5136 Other intervertebral disc degeneration, lumbar region: Secondary | ICD-10-CM | POA: Diagnosis not present

## 2021-10-17 DIAGNOSIS — E663 Overweight: Secondary | ICD-10-CM | POA: Diagnosis not present

## 2021-10-17 DIAGNOSIS — Z6825 Body mass index (BMI) 25.0-25.9, adult: Secondary | ICD-10-CM | POA: Diagnosis not present

## 2021-10-17 DIAGNOSIS — G894 Chronic pain syndrome: Secondary | ICD-10-CM | POA: Diagnosis not present

## 2021-10-17 DIAGNOSIS — E538 Deficiency of other specified B group vitamins: Secondary | ICD-10-CM | POA: Diagnosis not present

## 2021-10-26 DIAGNOSIS — E119 Type 2 diabetes mellitus without complications: Secondary | ICD-10-CM | POA: Diagnosis not present

## 2021-10-26 DIAGNOSIS — R6889 Other general symptoms and signs: Secondary | ICD-10-CM | POA: Diagnosis not present

## 2021-10-26 DIAGNOSIS — R42 Dizziness and giddiness: Secondary | ICD-10-CM | POA: Diagnosis not present

## 2021-10-26 DIAGNOSIS — Z6825 Body mass index (BMI) 25.0-25.9, adult: Secondary | ICD-10-CM | POA: Diagnosis not present

## 2021-10-26 DIAGNOSIS — E663 Overweight: Secondary | ICD-10-CM | POA: Diagnosis not present

## 2021-10-27 ENCOUNTER — Telehealth: Payer: Self-pay | Admitting: Cardiology

## 2021-10-27 NOTE — Telephone Encounter (Signed)
Pt walked in and said he went to his PCP Dr. Gerarda Fraction they took a EKG. He wants the newer one compared to the one done here. He stated the Dr. Azucena Fallen have anything to compare his new one too. He would like a call to discuss what we think.

## 2021-10-30 NOTE — Telephone Encounter (Signed)
No significant findings on EKG. What was the concern the pcp told him.    Zandra Abts MD

## 2021-10-30 NOTE — Telephone Encounter (Signed)
Spoke with pt who states that PCP noticed an irregular heartbeat and wanted it compared to an old EKG.

## 2021-11-06 DIAGNOSIS — R42 Dizziness and giddiness: Secondary | ICD-10-CM | POA: Diagnosis not present

## 2021-11-16 DIAGNOSIS — G894 Chronic pain syndrome: Secondary | ICD-10-CM | POA: Diagnosis not present

## 2021-11-16 DIAGNOSIS — M5136 Other intervertebral disc degeneration, lumbar region: Secondary | ICD-10-CM | POA: Diagnosis not present

## 2021-11-16 DIAGNOSIS — R6889 Other general symptoms and signs: Secondary | ICD-10-CM | POA: Diagnosis not present

## 2021-11-16 DIAGNOSIS — Z6825 Body mass index (BMI) 25.0-25.9, adult: Secondary | ICD-10-CM | POA: Diagnosis not present

## 2021-11-16 DIAGNOSIS — R42 Dizziness and giddiness: Secondary | ICD-10-CM | POA: Diagnosis not present

## 2021-11-27 ENCOUNTER — Telehealth: Payer: Self-pay | Admitting: Cardiology

## 2021-11-27 NOTE — Telephone Encounter (Signed)
Pt walked in and is concerned of being dizzy and "swimmy" headed. He said he recently had a CT done and nothing was found concerning. He is trying to pinpoint what is causing it. Would like a call to discuss what is happening and if it could be heart related, and if he needs to be seen before May when he is due for FU.

## 2021-11-28 NOTE — Telephone Encounter (Signed)
Spoke to pt who stated that he has been feeling "swimmy headed" for the past few weeks with a numb feeling. Pt stated that his bp has been fine ( he had no log, but said that he went to his PCP last week). Pt was wondering if he could possibly have a blockage causing his symptoms.

## 2021-11-29 ENCOUNTER — Ambulatory Visit: Payer: Medicare HMO | Admitting: Student

## 2021-11-29 ENCOUNTER — Other Ambulatory Visit: Payer: Self-pay

## 2021-11-29 ENCOUNTER — Encounter: Payer: Self-pay | Admitting: Student

## 2021-11-29 VITALS — BP 128/82 | HR 78 | Ht 68.0 in | Wt 172.2 lb

## 2021-11-29 DIAGNOSIS — I10 Essential (primary) hypertension: Secondary | ICD-10-CM | POA: Diagnosis not present

## 2021-11-29 DIAGNOSIS — I493 Ventricular premature depolarization: Secondary | ICD-10-CM | POA: Diagnosis not present

## 2021-11-29 DIAGNOSIS — N1832 Chronic kidney disease, stage 3b: Secondary | ICD-10-CM

## 2021-11-29 DIAGNOSIS — R42 Dizziness and giddiness: Secondary | ICD-10-CM

## 2021-11-29 NOTE — Patient Instructions (Signed)
Medication Instructions:  Your physician recommends that you continue on your current medications as directed. Please refer to the Current Medication list given to you today.  *If you need a refill on your cardiac medications before your next appointment, please call your pharmacy*   Lab Work: None If you have labs (blood work) drawn today and your tests are completely normal, you will receive your results only by: [place] (if you have MyChart) OR A paper copy in the mail If you have any lab test that is abnormal or we need to change your treatment, we will call you to review the results.   Testing/Procedures: Your physician has requested that you have an echocardiogram. Echocardiography is a painless test that uses sound waves to create images of your heart. It provides your doctor with information about the size and shape of your heart and how well your hearts chambers and valves are working. This procedure takes approximately one hour. There are no restrictions for this procedure.    Follow-Up: At [facility], you and your health needs are our priority.  As part of our continuing mission to provide you with exceptional heart care, we have created designated Provider Care Teams.  These Care Teams include your primary Cardiologist (physician) and Advanced Practice Providers (APPs -  Physician Assistants and Nurse Practitioners) who all work together to provide you with the care you need, when you need it.  We recommend signing up for the patient portal called "MyChart".  Sign up information is provided on this After Visit Summary.  MyChart is used to connect with patients for Virtual Visits (Telemedicine).  Patients are able to view lab/test results, encounter notes, upcoming appointments, etc.  Non-urgent messages can be sent to your provider as well.   To learn more about what you can do with MyChart, go to [email or web address].    Your next appointment:   Keep Follow Up  Scheduled for Dr. Harl Bowie   Other Instructions

## 2021-11-29 NOTE — Telephone Encounter (Signed)
Looks like he is seeing [place] today  Zandra Abts MD

## 2021-11-29 NOTE — Progress Notes (Signed)
Cardiology Office Note    Date:  11/29/2021   ID:  Trevor Ayala, DOB [birth year], MRN [id number]  PCP:  Redmond School, MD  Cardiologist: Carlyle Dolly, MD    Chief Complaint  Patient presents with   Follow-up    Dizziness    History of Present Illness:    Trevor Ayala is a 70 y.o. male with past medical history of palpitations (PVC's by prior monitor), HTN, Stage 3 CKD and prediabetes who presents to the office today for evaluation of dizziness.  He was last examined by Dr. Harl Bowie in 04/2021 and denied any recent chest pain or palpitations at that time. He was continued on his current medication regimen including Amlodipine 10 mg daily and Lisinopril 10 mg daily.  He did contact the office on 11/27/2021 reporting worsening dizziness and a follow-up visit was arranged.  In talking with the patient today, he reports worsening dizziness for the past 2 to 3 months. Reports his dizziness has occurred on a daily basis since onset and is typically worse with turning his head from side to side. No specific association with exertion or positional changes. Was evaluated by his PCP and a Head CT was ordered which showed no acute findings. Says he is very active at baseline as he is raising his 35-year-old granddaughter and denies any recent chest pain or dyspnea on exertion with activity. No recent orthopnea, PND or pitting edema. In reviewing the timeframe of his symptoms, he does report he was previously consuming caffeine-free sodas and did switch to regular Dr. Malachi Ayala around the timeframe his symptoms started.  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 2015: echo showing EF of 60-65%, no WMA,  Grade 1 DD, and mild MR. NST low-risk.    Renal calculus, left    Right ureteral stone    Wears glasses     Past Surgical History:  Procedure Laterality Date   COLONOSCOPY WITH PROPOFOL N/A 04/10/2021   Procedure: COLONOSCOPY WITH PROPOFOL;  Surgeon: Eloise Harman, DO;  [facility]: [facility];  Service: Endoscopy;  Laterality: N/A;  PM   CYSTOSCOPY W/ RETROGRADES Left 10/16/2017   Procedure: CYSTOSCOPY WITH URETEROSCOPY AND  RETROGRADE PYELOGRAM;  Surgeon: Alexis Frock, MD;  Location: [facility];  Service: Urology;  Laterality: Left;   CYSTOSCOPY WITH RETROGRADE PYELOGRAM, URETEROSCOPY AND STENT PLACEMENT Right 10/06/2014   Procedure: CYSTOSCOPY WITH RETROGRADE PYELOGRAM, URETEROSCOPY AND STENT PLACEMENT;  Surgeon: Alexis Frock, MD;  Location: [facility];  Service: Urology;  Laterality: Right;   HOLMIUM LASER APPLICATION Right 62/94/7654   Procedure: HOLMIUM LASER APPLICATION;  Surgeon: Alexis Frock, MD;  Location: [facility];  Service: Urology;  Laterality: Right;   HOLMIUM LASER APPLICATION Left 65/0/3546   Procedure: HOLMIUM LASER APPLICATION;  Surgeon: Alexis Frock, MD;  Location: [facility];  Service: Urology;  Laterality: Left;   NEPHROLITHOTOMY Left 10/16/2017   Procedure: NEPHROLITHOTOMY PERCUTANEOUS WITH SURGEON ACCESS;  Surgeon: Alexis Frock, MD;  Location: [facility];  Service: Urology;  Laterality: Left;   POLYPECTOMY  04/10/2021   Procedure: POLYPECTOMY;  Surgeon: Eloise Harman, DO;  [facility];  Service: Endoscopy;;   TRANSTHORACIC ECHOCARDIOGRAM  09-09-2014   mild LVH/  ef 38-46%/  grade I diastolic dysfunction/  mild MR/  mild sclerotic Av    Current Medications: Outpatient Medications Prior to Visit  Medication Sig Dispense Refill   amLODipine (NORVASC) 10 MG tablet Take 10 mg by mouth in the morning.     cyanocobalamin (,VITAMIN B-12,) 1000 MCG/ML injection Inject 1,000 mcg into the muscle once.     HYDROMET 5-1.5  MG/5ML syrup Take 5 mLs by mouth 4 (four) times daily as needed for cough.     lisinopril (ZESTRIL) 10 MG tablet Take 10 mg by mouth at bedtime.     oxyCODONE-acetaminophen (PERCOCET) 10-325 MG tablet Take 1 tablet by mouth every 6 (six) hours as needed (sciatica pain).     zolpidem (AMBIEN) 10 MG tablet Take 10 mg by mouth at bedtime as needed for sleep.     No facility-administered medications prior to visit.     Allergies:   Patient has no known allergies.   Social History   Socioeconomic History   Marital status: Married    Spouse name: Not on file   Number of children: Not on file   Years of education: Not on file   Highest education level: Not on file  Occupational History   Not on file  Tobacco Use   Smoking status: Never   Smokeless tobacco: Never  Vaping Use   Vaping Use: Never used  Substance and Sexual Activity   Alcohol use: No    Alcohol/week: 0.0 standard drinks   Drug use: No   Sexual activity: Not on file  Other Topics Concern   Not on file  Social History Narrative   Not on file   Social Determinants of Health   Financial Resource Strain: Not on file  Food Insecurity: Not on file  Transportation Needs: Not on file  Physical Activity: Not on file  Stress: Not on file  Social Connections: Not on file     Family History:  The patient's family history includes Cancer in his brother and mother.   Review of Systems:    Please see the history of present illness.     All other systems reviewed and are otherwise negative except as noted above.   Physical Exam:    VS:  BP 128/82    Pulse 78    Ht _0  (1.727 m)    Wt 172 lb 3.2 oz (78.1 kg)    SpO2 99%    BMI 26.18 kg/m    General: Well developed, well nourished,male appearing in no acute distress. Head: Normocephalic, atraumatic. Neck: No carotid bruits. JVD not elevated.  Lungs: Respirations regular and unlabored, without wheezes or rales.  Heart: Regular rate and rhythm with occasional ectopic  beats. No S3 or S4.  No murmur, no rubs, or gallops appreciated. Abdomen: Appears non-distended. No obvious abdominal masses. Msk:  Strength and tone appear normal for age. No obvious joint deformities or effusions. Extremities: No clubbing or cyanosis. No pitting edema.  Distal pedal pulses are 2+ bilaterally. Neuro: Alert and oriented X 3. Moves all extremities spontaneously. No focal deficits noted. Psych:  Responds to questions appropriately with a normal affect. Skin: No rashes or lesions noted  Wt Readings from Last 3 Encounters:  11/29/21 172 lb 3.2 oz (78.1 kg)  04/13/21 177 lb 12.8 oz (80.6 kg)  04/10/21 180 lb (81.6 kg)     Studies/Labs Reviewed:  EKG:  EKG is ordered today. The ekg ordered today demonstrates NSR, HR 78 with LAFB and occasional PVC's.   Recent Labs: No results found for requested labs within last 8760 hours.   Lipid Panel No results found for: CHOL, TRIG, HDL, CHOLHDL, VLDL, LDLCALC, LDLDIRECT  Additional studies/ records that were reviewed today include:   Echocardiogram: 09/2014 Study Conclusions   - Left ventricle: The cavity size was normal. Wall thickness was    increased in a pattern of mild LVH. Systolic function was normal.    The estimated ejection fraction was in the range of 60% to 65%.    Wall motion was normal; there were no regional wall motion    abnormalities. Doppler parameters are consistent with abnormal    left ventricular relaxation (grade 1 diastolic dysfunction).    Doppler parameters are consistent with elevated ventricular    end-diastolic filling pressure.  - Aortic valve: Mildly calcified annulus. Trileaflet; mildly    calcified leaflets.  - Mitral valve: Calcified annulus. There was mild regurgitation.  - Right atrium: Central venous pressure (est): 3 mm Hg.  - Tricuspid valve: There was trivial regurgitation.  - Pulmonary arteries: Systolic pressure could not be accurately    estimated.  - Pericardium, extracardiac:  There was no pericardial effusion.   Impressions:   - Mild LVH with LVEF 24-23%, grade 1 diastolic dysfunction with    increased filling pressures. MAC with mild mitral regurgitation.    Mildly sclerotic aortic valve. Trivial tricuspid regurgitation,    unable to assess PASP.    NST: 10/2014   IMPRESSION: 1. No reversible ischemia or infarction.   2. Normal left ventricular wall motion.   3. Left ventricular ejection fraction 64%   4. Low-risk stress test findings*.  Assessment:    1. Dizziness   2. PVC's (premature ventricular contractions)   3. Essential hypertension   4. Stage 3b chronic kidney disease ([place])      Plan:   In order of problems listed above:  1. Dizziness - Symptoms seem atypical for a cardiac etiology as they are worse with turning his head from side to side and not associated with positional changes or exertion. Orthostatics obtained today and negative. He does have PVC's on his EKG and had PVC's before and has overall been asymptomatic with these in the past. Given the time-frame since his last evaluation, will plan for a follow-up echocardiogram to assess for any structural abnormalities. I did recommend reduction in caffeine intake as symptoms progressed around the time he started to consume more regular sodas. If his cardiac work-up is reassuring, would consider ENT referral as symptoms do have qualities consistent with BPPV.   2. PVC's - He had frequent PVC's by prior monitor and was asymptomatic with these in the past. No recent palpitations. He did have recent labs with his PCP and I will request a copy of these. Not currently on AV nodal blocking agents given no reported palpitations.   3. HTN - BP initially elevated but improved to 128/82 on repeat check.  Continue current medication regimen with Amlodipine 10m daily and Lisinopril 154mdaily.   4. Stage 3 CKD - Creatinine was at 1.7 in 12/2020. Followed by [facility]    Medication Adjustments/Labs and Tests Ordered: Current medicines are reviewed at length with the patient today.  Concerns regarding medicines are outlined above.  Medication changes, Labs and Tests ordered today are listed in the Patient Instructions below. Patient Instructions  Medication  Instructions:  Your physician recommends that you continue on your current medications as directed. Please refer to the Current Medication list given to you today.  *If you need a refill on your cardiac medications before your next appointment, please call your pharmacy*   Lab Work: None If you have labs (blood work) drawn today and your tests are completely normal, you will receive your results only by: [place] (if you have MyChart) OR A paper copy in the mail If you have any lab test that is abnormal or we need to change your treatment, we will call you to review the results.   Testing/Procedures: Your physician has requested that you have an echocardiogram. Echocardiography is a painless test that uses sound waves to create images of your heart. It provides your doctor with information about the size and shape of your heart and how well your hearts chambers and valves are working. This procedure takes approximately one hour. There are no restrictions for this procedure.    Follow-Up: At [facility], you and your health needs are our priority.  As part of our continuing mission to provide you with exceptional heart care, we have created designated Provider Care Teams.  These Care Teams include your primary Cardiologist (physician) and Advanced Practice Providers (APPs -  Physician Assistants and Nurse Practitioners) who all work together to provide you with the care you need, when you need it.  We recommend signing up for the patient portal called "MyChart".  Sign up information is provided on this After Visit Summary.  MyChart is used to connect with patients for Virtual Visits  (Telemedicine).  Patients are able to view lab/test results, encounter notes, upcoming appointments, etc.  Non-urgent messages can be sent to your provider as well.   To learn more about what you can do with MyChart, go to [email or web address].    Your next appointment:   Keep Follow Up Scheduled for Dr. Harl Bowie   Other Instructions      Signed, Erma Heritage, PA-C  11/29/2021 7:56 PM    [facility]. [address] [place], [place] [address] Phone: [phone] Fax: [phone]

## 2021-12-15 DIAGNOSIS — R1084 Generalized abdominal pain: Secondary | ICD-10-CM | POA: Diagnosis not present

## 2021-12-15 DIAGNOSIS — N200 Calculus of kidney: Secondary | ICD-10-CM | POA: Diagnosis not present

## 2021-12-15 DIAGNOSIS — R42 Dizziness and giddiness: Secondary | ICD-10-CM | POA: Diagnosis not present

## 2021-12-15 DIAGNOSIS — N400 Enlarged prostate without lower urinary tract symptoms: Secondary | ICD-10-CM | POA: Diagnosis not present

## 2021-12-18 DIAGNOSIS — E1129 Type 2 diabetes mellitus with other diabetic kidney complication: Secondary | ICD-10-CM | POA: Diagnosis not present

## 2021-12-18 DIAGNOSIS — Z1331 Encounter for screening for depression: Secondary | ICD-10-CM | POA: Diagnosis not present

## 2021-12-18 DIAGNOSIS — Z6826 Body mass index (BMI) 26.0-26.9, adult: Secondary | ICD-10-CM | POA: Diagnosis not present

## 2021-12-18 DIAGNOSIS — E538 Deficiency of other specified B group vitamins: Secondary | ICD-10-CM | POA: Diagnosis not present

## 2021-12-18 DIAGNOSIS — Z0000 Encounter for general adult medical examination without abnormal findings: Secondary | ICD-10-CM | POA: Diagnosis not present

## 2021-12-18 DIAGNOSIS — G894 Chronic pain syndrome: Secondary | ICD-10-CM | POA: Diagnosis not present

## 2021-12-18 DIAGNOSIS — E663 Overweight: Secondary | ICD-10-CM | POA: Diagnosis not present

## 2021-12-18 DIAGNOSIS — M5136 Other intervertebral disc degeneration, lumbar region: Secondary | ICD-10-CM | POA: Diagnosis not present

## 2021-12-28 DIAGNOSIS — E119 Type 2 diabetes mellitus without complications: Secondary | ICD-10-CM | POA: Diagnosis not present

## 2021-12-28 DIAGNOSIS — Z0100 Encounter for examination of eyes and vision without abnormal findings: Secondary | ICD-10-CM | POA: Diagnosis not present

## 2022-01-05 ENCOUNTER — Other Ambulatory Visit: Payer: Self-pay

## 2022-01-05 ENCOUNTER — Ambulatory Visit (HOSPITAL_COMMUNITY)
Admission: RE | Admit: 2022-01-05 | Discharge: 2022-01-05 | Disposition: A | Discharge status: Home / Self Care | Payer: Medicare HMO | Source: Ambulatory Visit | Attending: Student | Admitting: Student

## 2022-01-05 DIAGNOSIS — I340 Nonrheumatic mitral (valve) insufficiency: Secondary | ICD-10-CM | POA: Diagnosis not present

## 2022-01-05 DIAGNOSIS — I493 Ventricular premature depolarization: Secondary | ICD-10-CM | POA: Diagnosis not present

## 2022-01-05 LAB — ECHOCARDIOGRAM COMPLETE
AR max vel: 2.69 cm2
AV Area VTI: 2.65 cm2
AV Area mean vel: 2.66 cm2
AV Mean grad: 5 mmHg
AV Peak grad: 11.2 mmHg
Ao pk vel: 1.67 m/s
Area-P 1/2: 3.2 cm2
Calc EF: 60.2 %
MV VTI: 3.54 cm2
S' Lateral: 3 cm
Single Plane A2C EF: 47.8 %
Single Plane A4C EF: 69.6 %

## 2022-01-05 NOTE — Progress Notes (Signed)
*  PRELIMINARY RESULTS* Echocardiogram 2D Echocardiogram has been performed.  Elpidio Anis 01/05/2022, 2:58 PM

## 2022-01-09 DIAGNOSIS — N281 Cyst of kidney, acquired: Secondary | ICD-10-CM | POA: Diagnosis not present

## 2022-01-09 DIAGNOSIS — Z125 Encounter for screening for malignant neoplasm of prostate: Secondary | ICD-10-CM | POA: Diagnosis not present

## 2022-01-09 DIAGNOSIS — R34 Anuria and oliguria: Secondary | ICD-10-CM | POA: Diagnosis not present

## 2022-01-09 DIAGNOSIS — N200 Calculus of kidney: Secondary | ICD-10-CM | POA: Diagnosis not present

## 2022-01-10 ENCOUNTER — Telehealth: Payer: Self-pay | Admitting: Student

## 2022-01-10 ENCOUNTER — Telehealth: Payer: Self-pay | Admitting: *Deleted

## 2022-01-10 DIAGNOSIS — I10 Essential (primary) hypertension: Secondary | ICD-10-CM

## 2022-01-10 NOTE — Telephone Encounter (Signed)
-----   Message from Erma Heritage, [place] sent at 01/05/2022  4:52 PM EST ----- Please let the patient know his echocardiogram showed the pumping function of his heart remains normal with an ejection fraction of 65 to 70%. No wall motion abnormalities. He does have some thickness of the heart muscle and good blood pressure control is essential. He only has mild leakage along the mitral valve and no significant valve abnormalities at this time. He was noted to have dilation of the ascending aorta up to 46 mm which was not mentioned in prior studies but no recent echos or CT for comparison. Would recommend repeat imaging in 6 months for reassessment of his aorta and this can be arranged at the time of his upcoming follow-up in 04/2022. Would recommend a BMET prior to that visit. Pending his renal function, he will likely require a CT for repeat assessment.

## 2022-01-10 NOTE — Telephone Encounter (Signed)
Pt notified of echo results and order placed for lab work.

## 2022-01-10 NOTE — Telephone Encounter (Signed)
Patient returned call for results, transferred to RN.

## 2022-01-10 NOTE — Telephone Encounter (Signed)
Pt notified and order placed 

## 2022-01-16 DIAGNOSIS — R6889 Other general symptoms and signs: Secondary | ICD-10-CM | POA: Diagnosis not present

## 2022-01-16 DIAGNOSIS — E538 Deficiency of other specified B group vitamins: Secondary | ICD-10-CM | POA: Diagnosis not present

## 2022-01-16 DIAGNOSIS — E1129 Type 2 diabetes mellitus with other diabetic kidney complication: Secondary | ICD-10-CM | POA: Diagnosis not present

## 2022-01-16 DIAGNOSIS — H8122 Vestibular neuronitis, left ear: Secondary | ICD-10-CM | POA: Diagnosis not present

## 2022-01-16 DIAGNOSIS — R42 Dizziness and giddiness: Secondary | ICD-10-CM | POA: Diagnosis not present

## 2022-01-16 DIAGNOSIS — E663 Overweight: Secondary | ICD-10-CM | POA: Diagnosis not present

## 2022-01-16 DIAGNOSIS — Z6826 Body mass index (BMI) 26.0-26.9, adult: Secondary | ICD-10-CM | POA: Diagnosis not present

## 2022-02-14 DIAGNOSIS — E663 Overweight: Secondary | ICD-10-CM | POA: Diagnosis not present

## 2022-02-14 DIAGNOSIS — Z6825 Body mass index (BMI) 25.0-25.9, adult: Secondary | ICD-10-CM | POA: Diagnosis not present

## 2022-02-14 DIAGNOSIS — E538 Deficiency of other specified B group vitamins: Secondary | ICD-10-CM | POA: Diagnosis not present

## 2022-02-14 DIAGNOSIS — M5136 Other intervertebral disc degeneration, lumbar region: Secondary | ICD-10-CM | POA: Diagnosis not present

## 2022-02-14 DIAGNOSIS — G894 Chronic pain syndrome: Secondary | ICD-10-CM | POA: Diagnosis not present

## 2022-02-14 DIAGNOSIS — I10 Essential (primary) hypertension: Secondary | ICD-10-CM | POA: Diagnosis not present

## 2022-02-26 DIAGNOSIS — Z6826 Body mass index (BMI) 26.0-26.9, adult: Secondary | ICD-10-CM | POA: Diagnosis not present

## 2022-02-26 DIAGNOSIS — R42 Dizziness and giddiness: Secondary | ICD-10-CM | POA: Diagnosis not present

## 2022-02-26 DIAGNOSIS — R6889 Other general symptoms and signs: Secondary | ICD-10-CM | POA: Diagnosis not present

## 2022-02-26 DIAGNOSIS — H01115 Allergic dermatitis of left lower eyelid: Secondary | ICD-10-CM | POA: Diagnosis not present

## 2022-02-26 DIAGNOSIS — E663 Overweight: Secondary | ICD-10-CM | POA: Diagnosis not present

## 2022-02-26 DIAGNOSIS — H01114 Allergic dermatitis of left upper eyelid: Secondary | ICD-10-CM | POA: Diagnosis not present

## 2022-03-14 DIAGNOSIS — I10 Essential (primary) hypertension: Secondary | ICD-10-CM | POA: Diagnosis not present

## 2022-03-14 DIAGNOSIS — G894 Chronic pain syndrome: Secondary | ICD-10-CM | POA: Diagnosis not present

## 2022-03-14 DIAGNOSIS — Z6825 Body mass index (BMI) 25.0-25.9, adult: Secondary | ICD-10-CM | POA: Diagnosis not present

## 2022-03-14 DIAGNOSIS — E663 Overweight: Secondary | ICD-10-CM | POA: Diagnosis not present

## 2022-03-14 DIAGNOSIS — H00019 Hordeolum externum unspecified eye, unspecified eyelid: Secondary | ICD-10-CM | POA: Diagnosis not present

## 2022-03-14 DIAGNOSIS — G4700 Insomnia, unspecified: Secondary | ICD-10-CM | POA: Diagnosis not present

## 2022-03-14 DIAGNOSIS — R42 Dizziness and giddiness: Secondary | ICD-10-CM | POA: Diagnosis not present

## 2022-03-14 DIAGNOSIS — M5136 Other intervertebral disc degeneration, lumbar region: Secondary | ICD-10-CM | POA: Diagnosis not present

## 2022-03-14 DIAGNOSIS — N183 Chronic kidney disease, stage 3 unspecified: Secondary | ICD-10-CM | POA: Diagnosis not present

## 2022-04-18 ENCOUNTER — Encounter: Payer: Self-pay | Admitting: Cardiology

## 2022-04-18 ENCOUNTER — Ambulatory Visit: Payer: Medicare HMO | Admitting: Cardiology

## 2022-04-18 ENCOUNTER — Other Ambulatory Visit (HOSPITAL_COMMUNITY)
Admission: RE | Admit: 2022-04-18 | Discharge: 2022-04-18 | Disposition: A | Discharge status: Home / Self Care | Payer: Medicare HMO | Source: Ambulatory Visit | Attending: Cardiology | Admitting: Cardiology

## 2022-04-18 VITALS — BP 112/58 | HR 90 | Ht 68.0 in | Wt 167.4 lb

## 2022-04-18 DIAGNOSIS — I10 Essential (primary) hypertension: Secondary | ICD-10-CM

## 2022-04-18 DIAGNOSIS — E663 Overweight: Secondary | ICD-10-CM | POA: Diagnosis not present

## 2022-04-18 DIAGNOSIS — Z6825 Body mass index (BMI) 25.0-25.9, adult: Secondary | ICD-10-CM | POA: Diagnosis not present

## 2022-04-18 DIAGNOSIS — M5136 Other intervertebral disc degeneration, lumbar region: Secondary | ICD-10-CM | POA: Diagnosis not present

## 2022-04-18 DIAGNOSIS — I77819 Aortic ectasia, unspecified site: Secondary | ICD-10-CM

## 2022-04-18 DIAGNOSIS — I493 Ventricular premature depolarization: Secondary | ICD-10-CM | POA: Diagnosis not present

## 2022-04-18 DIAGNOSIS — E1140 Type 2 diabetes mellitus with diabetic neuropathy, unspecified: Secondary | ICD-10-CM | POA: Diagnosis not present

## 2022-04-18 DIAGNOSIS — G894 Chronic pain syndrome: Secondary | ICD-10-CM | POA: Diagnosis not present

## 2022-04-18 LAB — BASIC METABOLIC PANEL
Anion gap: 6 (ref 5–15)
BUN: 30 mg/dL — ABNORMAL HIGH (ref 8–23)
CO2: 24 mmol/L (ref 22–32)
Calcium: 9.3 mg/dL (ref 8.9–10.3)
Chloride: 108 mmol/L (ref 98–111)
Creatinine, Ser: 2.35 mg/dL — ABNORMAL HIGH (ref 0.61–1.24)
GFR, Estimated: 29 mL/min — ABNORMAL LOW (ref >60)
Glucose, Bld: 124 mg/dL — ABNORMAL HIGH (ref 70–99)
Potassium: 4.9 mmol/L (ref 3.5–5.1)
Sodium: 138 mmol/L (ref 135–145)

## 2022-04-18 NOTE — Progress Notes (Signed)
? ? ? ?Clinical Summary ?Trevor Ayala is a 71 y.o.male seen today for follow up of the following medical problems.  ?  ?  ?1. Irregular heart beat/PVCs ?- completed holter monitor which showed fairly frequent PVCs (approx 12000), 2 short runs of NSVT. ?- echo and stress competed without significant abnormalities.   ?  ?Jan 2023 echo: LVEF 65-70%, no WMAs, mild MR ?  ?- - no recent palpitations.  ?  ?2. HTN ?-compliant with meds ?  ? ?3. Dizziness ?- seen 11/2021 by PA Trevor Ayala for dizziness ?- symptoms worst with head position, orthostatics were negative.  ?- reports was diagnosed with vertigo ? ? ?4. CKD 3 ?- followed by [facility] ? ?5. Dilated ascending aorta ?- noted by Jan 2023 echo, ascending aorta 4.6 cm. I reviewed study and confirmed measurement.  ? ?  ?SH: retired 4 years ago. Has custody of 13 year old granddaughter. 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  ?  ? ? ?Past Medical History:  ?Diagnosis Date  ? Asymptomatic PVCs   ? History of kidney stones   ? Hypertension   ? OSA on CPAP   ? 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 Pre-diabetes   ? was dx when at over 200lbs ; reports no meds or CBG checks since losing weight   ? PVC (premature ventricular contraction)   ? a. 2015: echo showing EF of 60-65%, no WMA, Grade 1 DD, and mild MR. NST low-risk.   ? Renal calculus, left   ? Right ureteral stone   ? Wears glasses   ? ? ? ?No Known Allergies ? ? ?Current Outpatient Medications  ?Medication Sig Dispense Refill  ? amLODipine (NORVASC) 10 MG tablet Take 10 mg by mouth in the morning.    ? cyanocobalamin (,VITAMIN B-12,) 1000 MCG/ML injection Inject 1,000 mcg into the muscle once.    ? HYDROMET 5-1.5 MG/5ML syrup Take 5 mLs by mouth 4 (four) times daily as needed for cough.    ? lisinopril (ZESTRIL) 10 MG tablet Take 10 mg by mouth at bedtime.    ? oxyCODONE-acetaminophen (PERCOCET) 10-325 MG tablet Take 1 tablet by mouth every 6 (six)  hours as needed (sciatica pain).    ? zolpidem (AMBIEN) 10 MG tablet Take 10 mg by mouth at bedtime as needed for sleep.    ? ?No current facility-administered medications for this visit.  ? ? ? ?Past Surgical History:  ?Procedure Laterality Date  ? COLONOSCOPY WITH PROPOFOL N/A 04/10/2021  ? Procedure: COLONOSCOPY WITH PROPOFOL;  Surgeon: Trevor Harman, DO;  [facility];  Service: Endoscopy;  Laterality: N/A;  PM  ? CYSTOSCOPY W/ RETROGRADES Left 10/16/2017  ? Procedure: CYSTOSCOPY WITH URETEROSCOPY AND  RETROGRADE PYELOGRAM;  Surgeon: Trevor Frock, MD;  Location: [facility];  Service: Urology;  Laterality: Left;  ? CYSTOSCOPY WITH RETROGRADE PYELOGRAM, URETEROSCOPY AND STENT PLACEMENT Right 10/06/2014  ? Procedure: CYSTOSCOPY WITH RETROGRADE PYELOGRAM, URETEROSCOPY AND STENT PLACEMENT;  Surgeon: Trevor Frock, MD;  Location: [facility];  Service: Urology;  Laterality: Right;  ? HOLMIUM LASER APPLICATION Right 16/09/9603  ? Procedure: HOLMIUM LASER APPLICATION;  Surgeon: Trevor Frock, MD;  Location: [facility];  Service: Urology;  Laterality: Right;  ? HOLMIUM LASER APPLICATION Left 54/0/9811  ? Procedure: HOLMIUM LASER APPLICATION;  Surgeon: Trevor Frock, MD;  Location: [facility];  Service: Urology;  Laterality: Left;  ? NEPHROLITHOTOMY Left 10/16/2017  ?  Procedure: NEPHROLITHOTOMY PERCUTANEOUS WITH SURGEON ACCESS;  Surgeon: Trevor Frock, MD;  Location: [facility];  Service: Urology;  Laterality: Left;  ? POLYPECTOMY  04/10/2021  ? Procedure: POLYPECTOMY;  Surgeon: Trevor Harman, DO;  [facility];  Service: Endoscopy;;  ? TRANSTHORACIC ECHOCARDIOGRAM  09-09-2014  ? mild LVH/  ef 29-93%/  grade I diastolic dysfunction/  mild MR/  mild sclerotic Av  ? ? ? ?No Known Allergies ? ? ? ?Family History  ?Problem Relation Age of Onset  ? Cancer Mother   ? Cancer Brother   ? Colon cancer Neg Hx   ? ? ? ?Social History ?Trevor Ayala reports that he has never smoked.  He has never used smokeless tobacco. ?Trevor Ayala reports no history of alcohol use. ? ? ?Review of Systems ?CONSTITUTIONAL: No weight loss, fever, chills, weakness or fatigue.  ?HEENT: Eyes: No visual loss, blurred vision, double vision or yellow sclerae.No hearing loss, sneezing, congestion, runny nose or sore throat.  ?SKIN: No rash or itching.  ?CARDIOVASCULAR: per hpi ?RESPIRATORY: No shortness of breath, cough or sputum.  ?GASTROINTESTINAL: No anorexia, nausea, vomiting or diarrhea. No abdominal pain or blood.  ?GENITOURINARY: No burning on urination, no polyuria ?NEUROLOGICAL: No headache, dizziness, syncope, paralysis, ataxia, numbness or tingling in the extremities. No change in bowel or bladder control.  ?MUSCULOSKELETAL: No muscle, back pain, joint pain or stiffness.  ?LYMPHATICS: No enlarged nodes. No history of splenectomy.  ?PSYCHIATRIC: No history of depression or anxiety.  ?ENDOCRINOLOGIC: No reports of sweating, cold or heat intolerance. No polyuria or polydipsia.  ?. ? ? ?Physical Examination ?Today's Vitals  ? 04/18/22 1111  ?BP: (!) 112/58  ?Pulse: 90  ?SpO2: 98%  ?Weight: 167 lb 6.4 oz (75.9 kg)  ?Height: _0  (1.727 m)  ? ?Body mass index is 25.45 kg/m?. ? ?Gen: resting comfortably, no acute distress ?HEENT: no scleral icterus, pupils equal round and reactive, no palptable cervical adenopathy,  ?CV: RRR, no m/r/g, no jvd ?Resp: Clear to auscultation bilaterally ?GI: abdomen is soft, non-tender, non-distended, normal bowel sounds, no hepatosplenomegaly ?MSK: extremities are warm, no edema.  ?Skin: warm, no rash ?Neuro:  no focal deficits ?Psych: appropriate affect ? ? ?Diagnostic Studies ? ?09/2014 Echo ?Study Conclusions ? ?- Left ventricle: The cavity size was normal. Wall thickness was ?  increased in a pattern of mild LVH. Systolic function was normal. ?  The estimated ejection fraction was in the range of 60% to 65%. ?  Wall motion was normal; there were no regional wall motion ?   abnormalities. Doppler parameters are consistent with abnormal ?  left ventricular relaxation (grade 1 diastolic dysfunction). ?  Doppler parameters are consistent with elevated ventricular ?  end-diastolic filling pressure. ?- Aortic valve: Mildly calcified annulus. Trileaflet; mildly ?  calcified leaflets. ?- Mitral valve: Calcified annulus. There was mild regurgitation. ?- Right atrium: Central venous pressure (est): 3 mm Hg. ?- Tricuspid valve: There was trivial regurgitation. ?- Pulmonary arteries: Systolic pressure could not be accurately ?  estimated. ?- Pericardium, extracardiac: There was no pericardial effusion. ? ?Impressions: ? ?- Mild LVH with LVEF 71-69%, grade 1 diastolic dysfunction with ?  increased filling pressures. MAC with mild mitral regurgitation. ?  Mildly sclerotic aortic valve. Trivial tricuspid regurgitation, ?  unable to assess PASP. ?  ?10/2014 MPI ?IMPRESSION: ?1. No reversible ischemia or infarction. ?  ?2. Normal left ventricular wall motion. ?  ?3. Left ventricular ejection fraction 64% ?  ?4. Low-risk stress test findings*. ?  ?  08/2014 Holter ?Frequent PVCs, 2 runs NSVT 6 and 5 beats ?  ? Jan 2023 echo ?1. Left ventricular ejection fraction, by estimation, is 65 to 70%. The  ?left ventricle has normal function. The left ventricle has no regional  ?wall motion abnormalities. There is moderate concentric left ventricular  ?hypertrophy. Left ventricular  ?diastolic parameters were normal.  ? 2. Right ventricular systolic function is normal. The right ventricular  ?size is normal. There is normal pulmonary artery systolic pressure. The  ?estimated right ventricular systolic pressure is 05.3 mmHg.  ? 3. The mitral valve is grossly normal. Mild mitral valve regurgitation.  ? 4. The aortic valve is tricuspid. Aortic valve regurgitation is trivial.  ?Aortic valve sclerosis is present, with no evidence of aortic valve  ?stenosis. Aortic valve mean gradient measures 5.0 mmHg.  ? 5. Aortic  dilatation noted. There is moderate dilatation of the ascending  ?aorta, measuring 46 mm.  ? 6. The inferior vena cava is normal in size with greater than 50%  ?respiratory variability, suggesting right atrial pressur

## 2022-04-18 NOTE — Patient Instructions (Signed)
Medication Instructions:  ?No changes ? ?Labwork: ?BMET ? ?Testing/Procedures: ?None ? ?Follow-Up: ?Follow up in 6 months with Dr. Harl Bowie ? ?Any Other Special Instructions Will Be Listed Below (If Applicable). ? ? ? ? ?If you need a refill on your cardiac medications before your next appointment, please call your pharmacy. ? ?

## 2022-04-23 ENCOUNTER — Other Ambulatory Visit: Payer: Self-pay | Admitting: Cardiology

## 2022-04-24 ENCOUNTER — Telehealth: Payer: Self-pay

## 2022-04-24 DIAGNOSIS — I719 Aortic aneurysm of unspecified site, without rupture: Secondary | ICD-10-CM

## 2022-04-24 NOTE — Telephone Encounter (Signed)
-----   Message from Merlene Laughter, RN sent at 04/23/2022 12:30 PM EDT ----- ?[facility] pt ? ?----- Message ----- ?From: Arnoldo Lenis, MD ?Sent: 04/23/2022  12:25 PM EDT ?To: Merlene Laughter, RN ? ?Kidney function mildly decreased from last year, he is followed already by [facility]. Can we order a MRA chest with and without contrast for aortic aneurysm please.  ? ?Zandra Abts MD ? ?

## 2022-04-25 NOTE — Telephone Encounter (Deleted)
-----   Message from Merlene Laughter, RN sent at 04/23/2022 12:30 PM EDT ----- ?[facility] pt ? ?----- Message ----- ?From: Arnoldo Lenis, MD ?Sent: 04/23/2022  12:25 PM EDT ?To: Merlene Laughter, RN ? ?Kidney function mildly decreased from last year, he is followed already by [facility]. Can we order a MRA chest with and without contrast for aortic aneurysm please.  ? ?Zandra Abts MD ? ?

## 2022-04-25 NOTE — Telephone Encounter (Signed)
Spoke to pt who verbalized understanding of plan. Pt agreeable to MRA chest w/wo contrast for AA.  ?

## 2022-05-17 DIAGNOSIS — E1165 Type 2 diabetes mellitus with hyperglycemia: Secondary | ICD-10-CM | POA: Diagnosis not present

## 2022-05-17 DIAGNOSIS — E663 Overweight: Secondary | ICD-10-CM | POA: Diagnosis not present

## 2022-05-17 DIAGNOSIS — N183 Chronic kidney disease, stage 3 unspecified: Secondary | ICD-10-CM | POA: Diagnosis not present

## 2022-05-17 DIAGNOSIS — I10 Essential (primary) hypertension: Secondary | ICD-10-CM | POA: Diagnosis not present

## 2022-05-17 DIAGNOSIS — G894 Chronic pain syndrome: Secondary | ICD-10-CM | POA: Diagnosis not present

## 2022-05-17 DIAGNOSIS — Z6825 Body mass index (BMI) 25.0-25.9, adult: Secondary | ICD-10-CM | POA: Diagnosis not present

## 2022-06-15 DIAGNOSIS — G894 Chronic pain syndrome: Secondary | ICD-10-CM | POA: Diagnosis not present

## 2022-06-15 DIAGNOSIS — G4733 Obstructive sleep apnea (adult) (pediatric): Secondary | ICD-10-CM | POA: Diagnosis not present

## 2022-06-15 DIAGNOSIS — E663 Overweight: Secondary | ICD-10-CM | POA: Diagnosis not present

## 2022-06-15 DIAGNOSIS — Z6826 Body mass index (BMI) 26.0-26.9, adult: Secondary | ICD-10-CM | POA: Diagnosis not present

## 2022-06-15 DIAGNOSIS — N1832 Chronic kidney disease, stage 3b: Secondary | ICD-10-CM | POA: Diagnosis not present

## 2022-06-15 DIAGNOSIS — E1140 Type 2 diabetes mellitus with diabetic neuropathy, unspecified: Secondary | ICD-10-CM | POA: Diagnosis not present

## 2022-06-15 DIAGNOSIS — G4709 Other insomnia: Secondary | ICD-10-CM | POA: Diagnosis not present

## 2022-06-26 ENCOUNTER — Ambulatory Visit (HOSPITAL_COMMUNITY)
Admission: RE | Admit: 2022-06-26 | Discharge: 2022-06-26 | Disposition: A | Discharge status: Home / Self Care | Payer: Medicare HMO | Source: Ambulatory Visit | Attending: Cardiology | Admitting: Cardiology

## 2022-06-26 DIAGNOSIS — I517 Cardiomegaly: Secondary | ICD-10-CM | POA: Diagnosis not present

## 2022-06-26 DIAGNOSIS — I712 Thoracic aortic aneurysm, without rupture, unspecified: Secondary | ICD-10-CM | POA: Diagnosis not present

## 2022-06-26 DIAGNOSIS — I719 Aortic aneurysm of unspecified site, without rupture: Secondary | ICD-10-CM | POA: Insufficient documentation

## 2022-06-26 MED ORDER — GADOBUTROL 1 MMOL/ML IV SOLN
10.0000 mL | Freq: Once | INTRAVENOUS | Status: AC | PRN
  Administered 2022-06-26: 10 mL via INTRAVENOUS

## 2022-06-28 LAB — POCT I-STAT CREATININE: Creatinine, Ser: 2.3 mg/dL — ABNORMAL HIGH (ref 0.61–1.24)

## 2022-07-02 ENCOUNTER — Telehealth: Payer: Self-pay

## 2022-07-02 NOTE — Telephone Encounter (Signed)
-----   Message from Arnoldo Lenis, MD sent at 06/27/2022  2:15 PM EDT ----- Heart MRI showed some enlargement of the aorta that is oderate, this is just something to monitor at this time.    Zandra Abts MD

## 2022-07-02 NOTE — Telephone Encounter (Signed)
Patient notified and verbalized understanding. Patient had no questions or concerns at this time. PCP copied 

## 2022-07-13 DIAGNOSIS — I10 Essential (primary) hypertension: Secondary | ICD-10-CM | POA: Diagnosis not present

## 2022-07-13 DIAGNOSIS — N184 Chronic kidney disease, stage 4 (severe): Secondary | ICD-10-CM | POA: Diagnosis not present

## 2022-07-13 DIAGNOSIS — E663 Overweight: Secondary | ICD-10-CM | POA: Diagnosis not present

## 2022-07-13 DIAGNOSIS — G894 Chronic pain syndrome: Secondary | ICD-10-CM | POA: Diagnosis not present

## 2022-07-13 DIAGNOSIS — G4709 Other insomnia: Secondary | ICD-10-CM | POA: Diagnosis not present

## 2022-07-13 DIAGNOSIS — E1140 Type 2 diabetes mellitus with diabetic neuropathy, unspecified: Secondary | ICD-10-CM | POA: Diagnosis not present

## 2022-07-13 DIAGNOSIS — Z6826 Body mass index (BMI) 26.0-26.9, adult: Secondary | ICD-10-CM | POA: Diagnosis not present

## 2022-09-11 DIAGNOSIS — E1122 Type 2 diabetes mellitus with diabetic chronic kidney disease: Secondary | ICD-10-CM | POA: Diagnosis not present

## 2022-09-11 DIAGNOSIS — Z6826 Body mass index (BMI) 26.0-26.9, adult: Secondary | ICD-10-CM | POA: Diagnosis not present

## 2022-09-11 DIAGNOSIS — E663 Overweight: Secondary | ICD-10-CM | POA: Diagnosis not present

## 2022-09-11 DIAGNOSIS — E1140 Type 2 diabetes mellitus with diabetic neuropathy, unspecified: Secondary | ICD-10-CM | POA: Diagnosis not present

## 2022-09-11 DIAGNOSIS — G894 Chronic pain syndrome: Secondary | ICD-10-CM | POA: Diagnosis not present

## 2022-09-11 DIAGNOSIS — I10 Essential (primary) hypertension: Secondary | ICD-10-CM | POA: Diagnosis not present

## 2022-10-12 DIAGNOSIS — I10 Essential (primary) hypertension: Secondary | ICD-10-CM | POA: Diagnosis not present

## 2022-10-12 DIAGNOSIS — E663 Overweight: Secondary | ICD-10-CM | POA: Diagnosis not present

## 2022-10-12 DIAGNOSIS — G894 Chronic pain syndrome: Secondary | ICD-10-CM | POA: Diagnosis not present

## 2022-10-12 DIAGNOSIS — Z6826 Body mass index (BMI) 26.0-26.9, adult: Secondary | ICD-10-CM | POA: Diagnosis not present

## 2022-10-12 DIAGNOSIS — E1122 Type 2 diabetes mellitus with diabetic chronic kidney disease: Secondary | ICD-10-CM | POA: Diagnosis not present

## 2022-10-12 DIAGNOSIS — N1832 Chronic kidney disease, stage 3b: Secondary | ICD-10-CM | POA: Diagnosis not present

## 2022-10-30 ENCOUNTER — Ambulatory Visit: Payer: Medicare HMO | Attending: Cardiology | Admitting: Cardiology

## 2022-10-30 ENCOUNTER — Encounter: Payer: Self-pay | Admitting: Cardiology

## 2022-10-30 VITALS — BP 128/84 | HR 85 | Ht 68.0 in | Wt 172.0 lb

## 2022-10-30 DIAGNOSIS — I10 Essential (primary) hypertension: Secondary | ICD-10-CM | POA: Diagnosis not present

## 2022-10-30 DIAGNOSIS — I719 Aortic aneurysm of unspecified site, without rupture: Secondary | ICD-10-CM | POA: Diagnosis not present

## 2022-10-30 DIAGNOSIS — I493 Ventricular premature depolarization: Secondary | ICD-10-CM

## 2022-10-30 MED ORDER — METOPROLOL TARTRATE 25 MG PO TABS: 12.5000 mg | ORAL_TABLET | Freq: Two times a day (BID) | ORAL | 3 refills | Status: DC

## 2022-10-30 NOTE — Patient Instructions (Addendum)
Medication Instructions:  Your physician has recommended you make the following change in your medication:  -Start Lopressor 12.5 mg tablets twice daily   Labwork: BMET   Testing/Procedures: MRA  Follow-Up: Follow up with Dr. Harl Bowie in 6 months.  Any Other Special Instructions Will Be Listed Below (If Applicable).     If you need a refill on your cardiac medications before your next appointment, please call your pharmacy.

## 2022-10-30 NOTE — Progress Notes (Signed)
Clinical Summary Trevor Ayala is a 71 y.o.male seen today for follow up of the following medical problems.      1. Irregular heart beat/PVCs - completed holter monitor which showed fairly frequent PVCs (approx 12000), 2 short runs of NSVT. - echo and stress competed without significant abnormalities.     Jan 2023 echo: LVEF 65-70%, no WMAs, mild MR   - no recent palpitations.    2. HTN -he is compliant with meds     3. Dizziness - seen 11/2021 by PA Strader for dizziness - symptoms worst with head position, orthostatics were negative.  - reports was diagnosed with vertigo  - no recent symptoms.      4. CKD 3 - followed by [facility]   5. Dilated ascending aorta - noted by Jan 2023 echo, ascending aorta 4.6 cm. I reviewed study and confirmed measurement.   06/2022 MRA chest: 5.2 cm ascending aorta.  - no symptoms     SH: retired 4 years ago. Has custody of 74 year old granddaughter. 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 2015: echo showing EF of 60-65%, no WMA, Grade 1 DD, and mild MR. NST low-risk.    Renal calculus, left    Right ureteral stone    Wears glasses      No Known Allergies   Current Outpatient Medications  Medication Sig Dispense Refill   amLODipine (NORVASC) 10 MG tablet Take 10 mg by mouth in the morning.     HYDROMET 5-1.5 MG/5ML syrup Take 5 mLs by mouth 4 (four) times daily as needed for cough.     lisinopril (ZESTRIL) 10 MG tablet Take 10 mg by mouth at bedtime.     oxyCODONE-acetaminophen (PERCOCET) 10-325 MG tablet Take 1 tablet by mouth every 6 (six) hours as  needed (sciatica pain).     zolpidem (AMBIEN) 10 MG tablet Take 10 mg by mouth at bedtime as needed for sleep.     No current facility-administered medications for this visit.     Past Surgical History:  Procedure Laterality Date   COLONOSCOPY WITH PROPOFOL N/A 04/10/2021   Procedure: COLONOSCOPY WITH PROPOFOL;  Surgeon: Eloise Harman, DO;  [facility];  Service: Endoscopy;  Laterality: N/A;  PM   CYSTOSCOPY W/ RETROGRADES Left 10/16/2017   Procedure: CYSTOSCOPY WITH URETEROSCOPY AND  RETROGRADE PYELOGRAM;  Surgeon: Alexis Frock, MD;  Location: [facility];  Service: Urology;  Laterality: Left;   CYSTOSCOPY WITH RETROGRADE PYELOGRAM, URETEROSCOPY AND STENT PLACEMENT Right 10/06/2014   Procedure: CYSTOSCOPY WITH RETROGRADE PYELOGRAM, URETEROSCOPY AND STENT PLACEMENT;  Surgeon: Alexis Frock, MD;  Location: [facility];  Service: Urology;  Laterality: Right;   HOLMIUM LASER APPLICATION Right 85/63/1497   Procedure: HOLMIUM LASER APPLICATION;  Surgeon: Alexis Frock, MD;  Location: [facility];  Service: Urology;  Laterality: Right;   HOLMIUM LASER APPLICATION Left 01/16/3784   Procedure: HOLMIUM LASER APPLICATION;  Surgeon: Alexis Frock, MD;  Location: [facility];  Service: Urology;  Laterality: Left;  NEPHROLITHOTOMY Left 10/16/2017   Procedure: NEPHROLITHOTOMY PERCUTANEOUS WITH SURGEON ACCESS;  Surgeon: Alexis Frock, MD;  Location: [facility];  Service: Urology;  Laterality: Left;   POLYPECTOMY  04/10/2021   Procedure: POLYPECTOMY;  Surgeon: Eloise Harman, DO;  [facility];  Service: Endoscopy;;   TRANSTHORACIC ECHOCARDIOGRAM  09-09-2014   mild LVH/  ef 06-26%/  grade I diastolic dysfunction/  mild MR/  mild sclerotic Av     No Known Allergies    Family History  Problem Relation Age of Onset   Cancer Mother    Cancer Brother    Colon cancer Neg Hx      Social History Trevor Ayala reports that he has never smoked. He has  never used smokeless tobacco. Trevor Ayala reports no history of alcohol use.   Review of Systems CONSTITUTIONAL: No weight loss, fever, chills, weakness or fatigue.  HEENT: Eyes: No visual loss, blurred vision, double vision or yellow sclerae.No hearing loss, sneezing, congestion, runny nose or sore throat.  SKIN: No rash or itching.  CARDIOVASCULAR: per hpi RESPIRATORY: No shortness of breath, cough or sputum.  GASTROINTESTINAL: No anorexia, nausea, vomiting or diarrhea. No abdominal pain or blood.  GENITOURINARY: No burning on urination, no polyuria NEUROLOGICAL: No headache, dizziness, syncope, paralysis, ataxia, numbness or tingling in the extremities. No change in bowel or bladder control.  MUSCULOSKELETAL: No muscle, back pain, joint pain or stiffness.  LYMPHATICS: No enlarged nodes. No history of splenectomy.  PSYCHIATRIC: No history of depression or anxiety.  ENDOCRINOLOGIC: No reports of sweating, cold or heat intolerance. No polyuria or polydipsia.  Marland Kitchen   Physical Examination Today's Vitals   10/30/22 1119  BP: 128/84  Pulse: 85  Weight: 172 lb (78 kg)  Height: _0  (1.727 m)   Body mass index is 26.15 kg/m.  Gen: resting comfortably, no acute distress HEENT: no scleral icterus, pupils equal round and reactive, no palptable cervical adenopathy,  CV: RRR, no m/r/g no jvd Resp: Clear to auscultation bilaterally GI: abdomen is soft, non-tender, non-distended, normal bowel sounds, no hepatosplenomegaly MSK: extremities are warm, no edema.  Skin: warm, no rash Neuro:  no focal deficits Psych: appropriate affect   Diagnostic Studies  09/2014 Echo Study Conclusions  - Left ventricle: The cavity size was normal. Wall thickness was   increased in a pattern of mild LVH. Systolic function was normal.   The estimated ejection fraction was in the range of 60% to 65%.   Wall motion was normal; there were no regional wall motion   abnormalities. Doppler parameters are  consistent with abnormal   left ventricular relaxation (grade 1 diastolic dysfunction).   Doppler parameters are consistent with elevated ventricular   end-diastolic filling pressure. - Aortic valve: Mildly calcified annulus. Trileaflet; mildly   calcified leaflets. - Mitral valve: Calcified annulus. There was mild regurgitation. - Right atrium: Central venous pressure (est): 3 mm Hg. - Tricuspid valve: There was trivial regurgitation. - Pulmonary arteries: Systolic pressure could not be accurately   estimated. - Pericardium, extracardiac: There was no pericardial effusion.  Impressions:  - Mild LVH with LVEF 94-85%, grade 1 diastolic dysfunction with   increased filling pressures. MAC with mild mitral regurgitation.   Mildly sclerotic aortic valve. Trivial tricuspid regurgitation,   unable to assess PASP.   10/2014 MPI IMPRESSION: 1. No reversible ischemia or infarction.   2. Normal left ventricular wall motion.   3. Left ventricular ejection fraction 64%   4. Low-risk stress test findings*.  08/2014 Holter Frequent PVCs, 2 runs NSVT 6 and 5 beats    Jan 2023 echo 1. Left ventricular ejection fraction, by estimation, is 65 to 70%. The  left ventricle has normal function. The left ventricle has no regional  wall motion abnormalities. There is moderate concentric left ventricular  hypertrophy. Left ventricular  diastolic parameters were normal.   2. Right ventricular systolic function is normal. The right ventricular  size is normal. There is normal pulmonary artery systolic pressure. The  estimated right ventricular systolic pressure is 22.6 mmHg.   3. The mitral valve is grossly normal. Mild mitral valve regurgitation.   4. The aortic valve is tricuspid. Aortic valve regurgitation is trivial.  Aortic valve sclerosis is present, with no evidence of aortic valve  stenosis. Aortic valve mean gradient measures 5.0 mmHg.   5. Aortic dilatation noted. There is moderate  dilatation of the ascending  aorta, measuring 46 mm.   6. The inferior vena cava is normal in size with greater than 50%  respiratory variability, suggesting right atrial pressure of 3 mmHg.    06/2022 MRA chest IMPRESSION: Uncomplicated fusiform aneurysmal dilatation of the ascending thoracic aorta measuring 52 mm in diameter. Recommend semi-annual imaging followup by CTA or MRA and referral to cardiothoracic surgery if not already obtained. This recommendation follows 2010 ACCF/AHA/AATS/ACR/ASA/SCA/SCAI/SIR/STS/SVM Guidelines for the Diagnosis and Management of Patients With Thoracic Aortic Disease. Circulation. 2010; 121: [id number]. Aortic aneurysm NOS (ICD10-I71.9)   Assessment and Plan  1. Irregular heart beat/PVCs - several year history, asymptomatic - negative workup for underlying heart disease with echo and stress test - no symptoms, we continue to monitor - EKG today shows NSR   2. HTN -he is at goal, continue current meds   3. Aortic dilatation - 06/2022 MRA 5.2 cm ascending aortic aneurysm - start lopressor 12.21m bid - needs repeat MRA mid January, MRA over CTA given his renal function - if progression of aneurysm will need referral to CT surgery          JArnoldo Lenis M.D.,

## 2022-10-31 ENCOUNTER — Encounter: Payer: Self-pay | Admitting: Physician Assistant

## 2022-11-12 ENCOUNTER — Other Ambulatory Visit (HOSPITAL_COMMUNITY)
Admission: RE | Admit: 2022-11-12 | Discharge: 2022-11-12 | Disposition: A | Discharge status: Home / Self Care | Payer: Medicare HMO | Source: Ambulatory Visit | Attending: Cardiology | Admitting: Cardiology

## 2022-11-12 DIAGNOSIS — I719 Aortic aneurysm of unspecified site, without rupture: Secondary | ICD-10-CM | POA: Diagnosis not present

## 2022-11-12 LAB — BASIC METABOLIC PANEL
Anion gap: 9 (ref 5–15)
BUN: 28 mg/dL — ABNORMAL HIGH (ref 8–23)
CO2: 22 mmol/L (ref 22–32)
Calcium: 8.9 mg/dL (ref 8.9–10.3)
Chloride: 106 mmol/L (ref 98–111)
Creatinine, Ser: 1.95 mg/dL — ABNORMAL HIGH (ref 0.61–1.24)
GFR, Estimated: 36 mL/min — ABNORMAL LOW (ref >60)
Glucose, Bld: 225 mg/dL — ABNORMAL HIGH (ref 70–99)
Potassium: 4.1 mmol/L (ref 3.5–5.1)
Sodium: 137 mmol/L (ref 135–145)

## 2022-11-20 ENCOUNTER — Ambulatory Visit (HOSPITAL_COMMUNITY)
Admission: RE | Admit: 2022-11-20 | Discharge: 2022-11-20 | Disposition: A | Discharge status: Home / Self Care | Payer: Medicare HMO | Source: Ambulatory Visit | Attending: Cardiology | Admitting: Cardiology

## 2022-11-20 DIAGNOSIS — I719 Aortic aneurysm of unspecified site, without rupture: Secondary | ICD-10-CM | POA: Diagnosis not present

## 2022-11-20 DIAGNOSIS — I712 Thoracic aortic aneurysm, without rupture, unspecified: Secondary | ICD-10-CM | POA: Diagnosis not present

## 2022-11-20 MED ORDER — GADOBUTROL 1 MMOL/ML IV SOLN
8.0000 mL | Freq: Once | INTRAVENOUS | Status: AC | PRN
  Administered 2022-11-20: 8 mL via INTRAVENOUS

## 2022-11-21 ENCOUNTER — Telehealth: Payer: Self-pay

## 2022-11-21 DIAGNOSIS — I10 Essential (primary) hypertension: Secondary | ICD-10-CM | POA: Diagnosis not present

## 2022-11-21 DIAGNOSIS — N1832 Chronic kidney disease, stage 3b: Secondary | ICD-10-CM | POA: Diagnosis not present

## 2022-11-21 DIAGNOSIS — E1140 Type 2 diabetes mellitus with diabetic neuropathy, unspecified: Secondary | ICD-10-CM | POA: Diagnosis not present

## 2022-11-21 DIAGNOSIS — Z6826 Body mass index (BMI) 26.0-26.9, adult: Secondary | ICD-10-CM | POA: Diagnosis not present

## 2022-11-21 DIAGNOSIS — E119 Type 2 diabetes mellitus without complications: Secondary | ICD-10-CM | POA: Diagnosis not present

## 2022-11-21 DIAGNOSIS — E1129 Type 2 diabetes mellitus with other diabetic kidney complication: Secondary | ICD-10-CM | POA: Diagnosis not present

## 2022-11-21 DIAGNOSIS — G894 Chronic pain syndrome: Secondary | ICD-10-CM | POA: Diagnosis not present

## 2022-11-21 DIAGNOSIS — E663 Overweight: Secondary | ICD-10-CM | POA: Diagnosis not present

## 2022-11-21 DIAGNOSIS — E1122 Type 2 diabetes mellitus with diabetic chronic kidney disease: Secondary | ICD-10-CM | POA: Diagnosis not present

## 2022-11-21 NOTE — Telephone Encounter (Signed)
Patient notified and verbalized understanding. Patient had no questions or concerns at this time. PCP copied 

## 2022-11-21 NOTE — Telephone Encounter (Signed)
-----   Message from Arnoldo Lenis, MD sent at 11/21/2022 12:37 PM EST ----- Stable aneurysm by MRI, we will continue to monitor  Zandra Abts MD

## 2023-01-03 DIAGNOSIS — Z0001 Encounter for general adult medical examination with abnormal findings: Secondary | ICD-10-CM | POA: Diagnosis not present

## 2023-01-03 DIAGNOSIS — N184 Chronic kidney disease, stage 4 (severe): Secondary | ICD-10-CM | POA: Diagnosis not present

## 2023-01-03 DIAGNOSIS — E1140 Type 2 diabetes mellitus with diabetic neuropathy, unspecified: Secondary | ICD-10-CM | POA: Diagnosis not present

## 2023-01-03 DIAGNOSIS — I712 Thoracic aortic aneurysm, without rupture, unspecified: Secondary | ICD-10-CM | POA: Diagnosis not present

## 2023-01-03 DIAGNOSIS — E039 Hypothyroidism, unspecified: Secondary | ICD-10-CM | POA: Diagnosis not present

## 2023-01-03 DIAGNOSIS — D518 Other vitamin B12 deficiency anemias: Secondary | ICD-10-CM | POA: Diagnosis not present

## 2023-01-03 DIAGNOSIS — Z125 Encounter for screening for malignant neoplasm of prostate: Secondary | ICD-10-CM | POA: Diagnosis not present

## 2023-01-03 DIAGNOSIS — H00019 Hordeolum externum unspecified eye, unspecified eyelid: Secondary | ICD-10-CM | POA: Diagnosis not present

## 2023-01-03 DIAGNOSIS — E1165 Type 2 diabetes mellitus with hyperglycemia: Secondary | ICD-10-CM | POA: Diagnosis not present

## 2023-01-03 DIAGNOSIS — E1129 Type 2 diabetes mellitus with other diabetic kidney complication: Secondary | ICD-10-CM | POA: Diagnosis not present

## 2023-01-03 DIAGNOSIS — E1122 Type 2 diabetes mellitus with diabetic chronic kidney disease: Secondary | ICD-10-CM | POA: Diagnosis not present

## 2023-01-03 DIAGNOSIS — E559 Vitamin D deficiency, unspecified: Secondary | ICD-10-CM | POA: Diagnosis not present

## 2023-01-03 DIAGNOSIS — I10 Essential (primary) hypertension: Secondary | ICD-10-CM | POA: Diagnosis not present

## 2023-01-08 DIAGNOSIS — R34 Anuria and oliguria: Secondary | ICD-10-CM | POA: Diagnosis not present

## 2023-01-09 DIAGNOSIS — H25013 Cortical age-related cataract, bilateral: Secondary | ICD-10-CM | POA: Diagnosis not present

## 2023-01-09 DIAGNOSIS — Z0100 Encounter for examination of eyes and vision without abnormal findings: Secondary | ICD-10-CM | POA: Diagnosis not present

## 2023-01-15 DIAGNOSIS — N189 Chronic kidney disease, unspecified: Secondary | ICD-10-CM | POA: Diagnosis not present

## 2023-01-15 DIAGNOSIS — N281 Cyst of kidney, acquired: Secondary | ICD-10-CM | POA: Diagnosis not present

## 2023-01-15 DIAGNOSIS — N200 Calculus of kidney: Secondary | ICD-10-CM | POA: Diagnosis not present

## 2023-01-15 DIAGNOSIS — Z125 Encounter for screening for malignant neoplasm of prostate: Secondary | ICD-10-CM | POA: Diagnosis not present

## 2023-01-15 DIAGNOSIS — R34 Anuria and oliguria: Secondary | ICD-10-CM | POA: Diagnosis not present

## 2023-02-01 DIAGNOSIS — I719 Aortic aneurysm of unspecified site, without rupture: Secondary | ICD-10-CM | POA: Diagnosis not present

## 2023-02-01 DIAGNOSIS — E1165 Type 2 diabetes mellitus with hyperglycemia: Secondary | ICD-10-CM | POA: Diagnosis not present

## 2023-02-01 DIAGNOSIS — N184 Chronic kidney disease, stage 4 (severe): Secondary | ICD-10-CM | POA: Diagnosis not present

## 2023-02-01 DIAGNOSIS — I712 Thoracic aortic aneurysm, without rupture, unspecified: Secondary | ICD-10-CM | POA: Diagnosis not present

## 2023-02-01 DIAGNOSIS — E1129 Type 2 diabetes mellitus with other diabetic kidney complication: Secondary | ICD-10-CM | POA: Diagnosis not present

## 2023-02-01 DIAGNOSIS — H00019 Hordeolum externum unspecified eye, unspecified eyelid: Secondary | ICD-10-CM | POA: Diagnosis not present

## 2023-02-01 DIAGNOSIS — E1122 Type 2 diabetes mellitus with diabetic chronic kidney disease: Secondary | ICD-10-CM | POA: Diagnosis not present

## 2023-02-01 DIAGNOSIS — E1140 Type 2 diabetes mellitus with diabetic neuropathy, unspecified: Secondary | ICD-10-CM | POA: Diagnosis not present

## 2023-02-01 DIAGNOSIS — I10 Essential (primary) hypertension: Secondary | ICD-10-CM | POA: Diagnosis not present

## 2023-02-07 ENCOUNTER — Telehealth: Payer: Self-pay | Admitting: Cardiology

## 2023-02-07 NOTE — Telephone Encounter (Signed)
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 He wants to know if Dr. Harl Bowie will sign off on it before he makes his decision about driving. [phone] is the best number to reach him at.

## 2023-02-07 NOTE — Telephone Encounter (Signed)
Patient stated that there is no paperwork to fill out at this time. Pt stated he would drop future paperwork off at the [place] office.

## 2023-02-07 NOTE — Telephone Encounter (Signed)
Nothing cardiac wise that would prevent him from driving. Is there paperwork we need to fill out?  J Germaine Shenker MD

## 2023-03-11 DIAGNOSIS — E1165 Type 2 diabetes mellitus with hyperglycemia: Secondary | ICD-10-CM | POA: Diagnosis not present

## 2023-03-11 DIAGNOSIS — N184 Chronic kidney disease, stage 4 (severe): Secondary | ICD-10-CM | POA: Diagnosis not present

## 2023-03-11 DIAGNOSIS — E1122 Type 2 diabetes mellitus with diabetic chronic kidney disease: Secondary | ICD-10-CM | POA: Diagnosis not present

## 2023-03-11 DIAGNOSIS — Z6826 Body mass index (BMI) 26.0-26.9, adult: Secondary | ICD-10-CM | POA: Diagnosis not present

## 2023-03-11 DIAGNOSIS — I10 Essential (primary) hypertension: Secondary | ICD-10-CM | POA: Diagnosis not present

## 2023-03-11 DIAGNOSIS — I712 Thoracic aortic aneurysm, without rupture, unspecified: Secondary | ICD-10-CM | POA: Diagnosis not present

## 2023-03-11 DIAGNOSIS — E1140 Type 2 diabetes mellitus with diabetic neuropathy, unspecified: Secondary | ICD-10-CM | POA: Diagnosis not present

## 2023-03-11 DIAGNOSIS — I719 Aortic aneurysm of unspecified site, without rupture: Secondary | ICD-10-CM | POA: Diagnosis not present

## 2023-03-11 DIAGNOSIS — H00015 Hordeolum externum left lower eyelid: Secondary | ICD-10-CM | POA: Diagnosis not present

## 2023-04-05 DIAGNOSIS — H903 Sensorineural hearing loss, bilateral: Secondary | ICD-10-CM | POA: Diagnosis not present

## 2023-04-05 DIAGNOSIS — H9313 Tinnitus, bilateral: Secondary | ICD-10-CM | POA: Diagnosis not present

## 2023-04-05 DIAGNOSIS — R42 Dizziness and giddiness: Secondary | ICD-10-CM | POA: Diagnosis not present

## 2023-04-09 DIAGNOSIS — N1832 Chronic kidney disease, stage 3b: Secondary | ICD-10-CM | POA: Diagnosis not present

## 2023-04-09 DIAGNOSIS — H8122 Vestibular neuronitis, left ear: Secondary | ICD-10-CM | POA: Diagnosis not present

## 2023-04-09 DIAGNOSIS — E1165 Type 2 diabetes mellitus with hyperglycemia: Secondary | ICD-10-CM | POA: Diagnosis not present

## 2023-04-09 DIAGNOSIS — I10 Essential (primary) hypertension: Secondary | ICD-10-CM | POA: Diagnosis not present

## 2023-04-09 DIAGNOSIS — I719 Aortic aneurysm of unspecified site, without rupture: Secondary | ICD-10-CM | POA: Diagnosis not present

## 2023-04-09 DIAGNOSIS — E1140 Type 2 diabetes mellitus with diabetic neuropathy, unspecified: Secondary | ICD-10-CM | POA: Diagnosis not present

## 2023-04-09 DIAGNOSIS — N184 Chronic kidney disease, stage 4 (severe): Secondary | ICD-10-CM | POA: Diagnosis not present

## 2023-04-09 DIAGNOSIS — I712 Thoracic aortic aneurysm, without rupture, unspecified: Secondary | ICD-10-CM | POA: Diagnosis not present

## 2023-04-09 DIAGNOSIS — E1122 Type 2 diabetes mellitus with diabetic chronic kidney disease: Secondary | ICD-10-CM | POA: Diagnosis not present

## 2023-04-19 ENCOUNTER — Encounter: Payer: Self-pay | Admitting: Cardiology

## 2023-04-19 ENCOUNTER — Ambulatory Visit: Payer: Medicare HMO | Attending: Cardiology | Admitting: Cardiology

## 2023-04-19 VITALS — BP 140/80 | HR 52 | Ht 68.0 in | Wt 172.0 lb

## 2023-04-19 DIAGNOSIS — I10 Essential (primary) hypertension: Secondary | ICD-10-CM

## 2023-04-19 DIAGNOSIS — I493 Ventricular premature depolarization: Secondary | ICD-10-CM | POA: Diagnosis not present

## 2023-04-19 DIAGNOSIS — I719 Aortic aneurysm of unspecified site, without rupture: Secondary | ICD-10-CM

## 2023-04-19 NOTE — Progress Notes (Signed)
Clinical Summary [name] is a 72 y.o.male seen today for follow up of the following medical problems.      1. Irregular heart beat/PVCs - completed holter monitor which showed fairly frequent PVCs (approx 12000), 2 short runs of NSVT. - echo and stress competed without significant abnormalities.     Jan 2023 echo: LVEF 65-70%, no WMAs, mild MR   - denies any palpitations.    2. HTN - compliant with meds - reports well contorlled at several pcp visits   3. Dizziness - seen 11/2021 by PA Strader for dizziness - symptoms worst with head position, orthostatics were negative.  - reports was diagnosed with vertigo   - no recent symptoms.      4. CKD 3 - followed by [facility]   5. Dilated ascending aorta - noted by Jan 2023 echo, ascending aorta 4.6 cm. I reviewed study and confirmed measurement.    06/2022 MRA chest: 5.2 cm ascending aorta.  11/2022 MRA: stable 5.2 cm ascending aortic aneurysm    SH: retired 5 years ago. Has custody of 1 year old granddaughter. 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 2015: echo showing EF of 60-65%, no WMA, Grade 1 DD, and mild MR. NST low-risk.    Renal calculus, left    Right ureteral stone    Wears glasses      No Known Allergies   Current Outpatient Medications  Medication Sig Dispense Refill   amLODipine (NORVASC) 10 MG tablet Take 10 mg by mouth in the morning.     lisinopril (ZESTRIL) 10 MG tablet Take 10 mg by mouth at bedtime.     metoprolol tartrate (LOPRESSOR) 25 MG tablet Take 0.5 tablets (12.5 mg total) by mouth 2 (two) times daily. 90  tablet 3   oxyCODONE-acetaminophen (PERCOCET) 10-325 MG tablet Take 1 tablet by mouth every 6 (six) hours as needed (sciatica pain).     zolpidem (AMBIEN) 10 MG tablet Take 10 mg by mouth at bedtime as needed for sleep.     No current facility-administered medications for this visit.     Past Surgical History:  Procedure Laterality Date   COLONOSCOPY WITH PROPOFOL N/A 04/10/2021   Procedure: COLONOSCOPY WITH PROPOFOL;  Surgeon: Lanelle Bal, DO;  [facility];  Service: Endoscopy;  Laterality: N/A;  PM   CYSTOSCOPY W/ RETROGRADES Left 10/16/2017   Procedure: CYSTOSCOPY WITH URETEROSCOPY AND  RETROGRADE PYELOGRAM;  Surgeon: Sebastian Ache, MD;  Location: [facility];  Service: Urology;  Laterality: Left;   CYSTOSCOPY WITH RETROGRADE PYELOGRAM, URETEROSCOPY AND STENT PLACEMENT Right 10/06/2014   Procedure: CYSTOSCOPY WITH RETROGRADE PYELOGRAM, URETEROSCOPY AND STENT PLACEMENT;  Surgeon: Sebastian Ache, MD;  Location: [facility];  Service: Urology;  Laterality: Right;   HOLMIUM LASER APPLICATION Right 10/06/2014   Procedure: HOLMIUM LASER APPLICATION;  Surgeon: Sebastian Ache, MD;  Location: [facility];  Service: Urology;  Laterality: Right;   HOLMIUM LASER APPLICATION Left 10/16/2017   Procedure: HOLMIUM LASER APPLICATION;  Surgeon: Berneice Heinrich,  Normand Sloop, MD;  Location: [facility];  Service: Urology;  Laterality: Left;   NEPHROLITHOTOMY Left 10/16/2017   Procedure: NEPHROLITHOTOMY PERCUTANEOUS WITH SURGEON ACCESS;  Surgeon: Sebastian Ache, MD;  Location: [facility];  Service: Urology;  Laterality: Left;   POLYPECTOMY  04/10/2021   Procedure: POLYPECTOMY;  Surgeon: Lanelle Bal, DO;  [facility];  Service: Endoscopy;;   TRANSTHORACIC ECHOCARDIOGRAM  09-09-2014   mild LVH/  ef 60-65%/  grade I diastolic dysfunction/  mild MR/  mild sclerotic Av     No Known Allergies    Family History  Problem Relation Age of Onset   Cancer Mother    Cancer  Brother    Colon cancer Neg Hx      Social History [name] reports that he has never smoked. He has never used smokeless tobacco. [name] reports no history of alcohol use.   Review of Systems CONSTITUTIONAL: No weight loss, fever, chills, weakness or fatigue.  HEENT: Eyes: No visual loss, blurred vision, double vision or yellow sclerae.No hearing loss, sneezing, congestion, runny nose or sore throat.  SKIN: No rash or itching.  CARDIOVASCULAR: per hpi RESPIRATORY: No shortness of breath, cough or sputum.  GASTROINTESTINAL: No anorexia, nausea, vomiting or diarrhea. No abdominal pain or blood.  GENITOURINARY: No burning on urination, no polyuria NEUROLOGICAL: No headache, dizziness, syncope, paralysis, ataxia, numbness or tingling in the extremities. No change in bowel or bladder control.  MUSCULOSKELETAL: No muscle, back pain, joint pain or stiffness.  LYMPHATICS: No enlarged nodes. No history of splenectomy.  PSYCHIATRIC: No history of depression or anxiety.  ENDOCRINOLOGIC: No reports of sweating, cold or heat intolerance. No polyuria or polydipsia.  Marland Kitchen   Physical Examination Today's Vitals   04/19/23 0936 04/19/23 1005  BP: (!) 150/76 (!) 140/80  Pulse: (!) 52   SpO2: 99%   Weight: 172 lb (78 kg)   Height: 5\' 8"  (1.727 m)    Body mass index is 26.15 kg/m.  Gen: resting comfortably, no acute distress HEENT: no scleral icterus, pupils equal round and reactive, no palptable cervical adenopathy,  CV: RRR, no m/rg, no jvd Resp: Clear to auscultation bilaterally GI: abdomen is soft, non-tender, non-distended, normal bowel sounds, no hepatosplenomegaly MSK: extremities are warm, no edema.  Skin: warm, no rash Neuro:  no focal deficits Psych: appropriate affect   Diagnostic Studies 09/2014 Echo Study Conclusions  - Left ventricle: The cavity size was normal. Wall thickness was   increased in a pattern of mild LVH. Systolic function was normal.   The estimated  ejection fraction was in the range of 60% to 65%.   Wall motion was normal; there were no regional wall motion   abnormalities. Doppler parameters are consistent with abnormal   left ventricular relaxation (grade 1 diastolic dysfunction).   Doppler parameters are consistent with elevated ventricular   end-diastolic filling pressure. - Aortic valve: Mildly calcified annulus. Trileaflet; mildly   calcified leaflets. - Mitral valve: Calcified annulus. There was mild regurgitation. - Right atrium: Central venous pressure (est): 3 mm Hg. - Tricuspid valve: There was trivial regurgitation. - Pulmonary arteries: Systolic pressure could not be accurately   estimated. - Pericardium, extracardiac: There was no pericardial effusion.  Impressions:  - Mild LVH with LVEF 60-65%, grade 1 diastolic dysfunction with   increased filling pressures. MAC with mild mitral regurgitation.   Mildly sclerotic aortic valve. Trivial tricuspid regurgitation,   unable to assess PASP.   10/2014 MPI IMPRESSION: 1. No reversible ischemia or  infarction.   2. Normal left ventricular wall motion.   3. Left ventricular ejection fraction 64%   4. Low-risk stress test findings*.   08/2014 Holter Frequent PVCs, 2 runs NSVT 6 and 5 beats    Jan 2023 echo 1. Left ventricular ejection fraction, by estimation, is 65 to 70%. The  left ventricle has normal function. The left ventricle has no regional  wall motion abnormalities. There is moderate concentric left ventricular  hypertrophy. Left ventricular  diastolic parameters were normal.   2. Right ventricular systolic function is normal. The right ventricular  size is normal. There is normal pulmonary artery systolic pressure. The  estimated right ventricular systolic pressure is 13.6 mmHg.   3. The mitral valve is grossly normal. Mild mitral valve regurgitation.   4. The aortic valve is tricuspid. Aortic valve regurgitation is trivial.  Aortic valve sclerosis is  present, with no evidence of aortic valve  stenosis. Aortic valve mean gradient measures 5.0 mmHg.   5. Aortic dilatation noted. There is moderate dilatation of the ascending  aorta, measuring 46 mm.   6. The inferior vena cava is normal in size with greater than 50%  respiratory variability, suggesting right atrial pressure of 3 mmHg.      06/2022 MRA chest IMPRESSION: Uncomplicated fusiform aneurysmal dilatation of the ascending thoracic aorta measuring 52 mm in diameter. Recommend semi-annual imaging followup by CTA or MRA and referral to cardiothoracic surgery if not already obtained. This recommendation follows 2010 ACCF/AHA/AATS/ACR/ASA/SCA/SCAI/SIR/STS/SVM Guidelines for the Diagnosis and Management of Patients With Thoracic Aortic Disease. Circulation. 2010; 121: E266-e369TAA. Aortic aneurysm NOS (ICD10-I71.9)    Assessment and Plan   1. Irregular heart beat/PVCs - several year history, asymptomatic - negative workup for underlying heart disease with echo and stress test - remains asymptomatic, continue to monitor   2. HTN -he is at goal, continue current meds   3. Aortic aneurysm - stable by recent MRA. 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

## 2023-04-19 NOTE — Patient Instructions (Signed)
Medication Instructions:  Your physician recommends that you continue on your current medications as directed. Please refer to the Current Medication list given to you today.  *If you need a refill on your cardiac medications before your next appointment, please call your pharmacy*   Lab Work: None If you have labs (blood work) drawn today and your tests are completely normal, you will receive your results only by: MyChart Message (if you have MyChart) OR A paper copy in the mail If you have any lab test that is abnormal or we need to change your treatment, we will call you to review the results.   Testing/Procedures: None   Follow-Up: At [facility], you and your health needs are our priority.  As part of our continuing mission to provide you with exceptional heart care, we have created designated Provider Care Teams.  These Care Teams include your primary Cardiologist (physician) and Advanced Practice Providers (APPs -  Physician Assistants and Nurse Practitioners) who all work together to provide you with the care you need, when you need it.  We recommend signing up for the patient portal called "MyChart".  Sign up information is provided on this After Visit Summary.  MyChart is used to connect with patients for Virtual Visits (Telemedicine).  Patients are able to view lab/test results, encounter notes, upcoming appointments, etc.  Non-urgent messages can be sent to your provider as well.   To learn more about what you can do with MyChart, go to [email or web address].    Your next appointment:   6 month(s)  Provider:   Dina Rich, MD    Other Instructions In 1 month- Nurse Visit for blood pressure check. Please keep a log of home blood pressures and bring with you to this visit.

## 2023-04-23 DIAGNOSIS — R42 Dizziness and giddiness: Secondary | ICD-10-CM | POA: Diagnosis not present

## 2023-04-30 DIAGNOSIS — H9313 Tinnitus, bilateral: Secondary | ICD-10-CM | POA: Diagnosis not present

## 2023-04-30 DIAGNOSIS — H903 Sensorineural hearing loss, bilateral: Secondary | ICD-10-CM | POA: Diagnosis not present

## 2023-04-30 DIAGNOSIS — R42 Dizziness and giddiness: Secondary | ICD-10-CM | POA: Diagnosis not present

## 2023-05-20 ENCOUNTER — Ambulatory Visit: Payer: Medicare HMO | Attending: Internal Medicine

## 2023-05-20 VITALS — BP 136/76 | HR 52 | Ht 68.0 in | Wt 170.0 lb

## 2023-05-20 DIAGNOSIS — Z0130 Encounter for examination of blood pressure without abnormal findings: Secondary | ICD-10-CM | POA: Diagnosis not present

## 2023-05-20 NOTE — Progress Notes (Signed)
Nurse visit BP check 136/76   I will forward to Dr.Branch  Patient asks if we can provide note like we did in 2022 stating he can drive w/o restrictions

## 2023-06-10 DIAGNOSIS — I719 Aortic aneurysm of unspecified site, without rupture: Secondary | ICD-10-CM | POA: Diagnosis not present

## 2023-06-10 DIAGNOSIS — E1165 Type 2 diabetes mellitus with hyperglycemia: Secondary | ICD-10-CM | POA: Diagnosis not present

## 2023-06-10 DIAGNOSIS — E1122 Type 2 diabetes mellitus with diabetic chronic kidney disease: Secondary | ICD-10-CM | POA: Diagnosis not present

## 2023-06-10 DIAGNOSIS — I712 Thoracic aortic aneurysm, without rupture, unspecified: Secondary | ICD-10-CM | POA: Diagnosis not present

## 2023-06-10 DIAGNOSIS — Z6826 Body mass index (BMI) 26.0-26.9, adult: Secondary | ICD-10-CM | POA: Diagnosis not present

## 2023-06-10 DIAGNOSIS — I10 Essential (primary) hypertension: Secondary | ICD-10-CM | POA: Diagnosis not present

## 2023-06-10 DIAGNOSIS — E1140 Type 2 diabetes mellitus with diabetic neuropathy, unspecified: Secondary | ICD-10-CM | POA: Diagnosis not present

## 2023-06-10 DIAGNOSIS — H01006 Unspecified blepharitis left eye, unspecified eyelid: Secondary | ICD-10-CM | POA: Diagnosis not present

## 2023-06-10 DIAGNOSIS — N1832 Chronic kidney disease, stage 3b: Secondary | ICD-10-CM | POA: Diagnosis not present

## 2023-07-03 DIAGNOSIS — I712 Thoracic aortic aneurysm, without rupture, unspecified: Secondary | ICD-10-CM | POA: Diagnosis not present

## 2023-07-03 DIAGNOSIS — N184 Chronic kidney disease, stage 4 (severe): Secondary | ICD-10-CM | POA: Diagnosis not present

## 2023-07-03 DIAGNOSIS — I719 Aortic aneurysm of unspecified site, without rupture: Secondary | ICD-10-CM | POA: Diagnosis not present

## 2023-07-03 DIAGNOSIS — E1122 Type 2 diabetes mellitus with diabetic chronic kidney disease: Secondary | ICD-10-CM | POA: Diagnosis not present

## 2023-07-03 DIAGNOSIS — I10 Essential (primary) hypertension: Secondary | ICD-10-CM | POA: Diagnosis not present

## 2023-07-03 DIAGNOSIS — E1140 Type 2 diabetes mellitus with diabetic neuropathy, unspecified: Secondary | ICD-10-CM | POA: Diagnosis not present

## 2023-07-03 DIAGNOSIS — Z6825 Body mass index (BMI) 25.0-25.9, adult: Secondary | ICD-10-CM | POA: Diagnosis not present

## 2023-07-03 DIAGNOSIS — E1165 Type 2 diabetes mellitus with hyperglycemia: Secondary | ICD-10-CM | POA: Diagnosis not present

## 2023-08-02 DIAGNOSIS — H01006 Unspecified blepharitis left eye, unspecified eyelid: Secondary | ICD-10-CM | POA: Diagnosis not present

## 2023-08-02 DIAGNOSIS — N1832 Chronic kidney disease, stage 3b: Secondary | ICD-10-CM | POA: Diagnosis not present

## 2023-08-02 DIAGNOSIS — G4733 Obstructive sleep apnea (adult) (pediatric): Secondary | ICD-10-CM | POA: Diagnosis not present

## 2023-08-02 DIAGNOSIS — E1122 Type 2 diabetes mellitus with diabetic chronic kidney disease: Secondary | ICD-10-CM | POA: Diagnosis not present

## 2023-08-02 DIAGNOSIS — G894 Chronic pain syndrome: Secondary | ICD-10-CM | POA: Diagnosis not present

## 2023-08-02 DIAGNOSIS — E1165 Type 2 diabetes mellitus with hyperglycemia: Secondary | ICD-10-CM | POA: Diagnosis not present

## 2023-08-02 DIAGNOSIS — I10 Essential (primary) hypertension: Secondary | ICD-10-CM | POA: Diagnosis not present

## 2023-08-02 DIAGNOSIS — I712 Thoracic aortic aneurysm, without rupture, unspecified: Secondary | ICD-10-CM | POA: Diagnosis not present

## 2023-08-02 DIAGNOSIS — E1140 Type 2 diabetes mellitus with diabetic neuropathy, unspecified: Secondary | ICD-10-CM | POA: Diagnosis not present

## 2023-08-07 DIAGNOSIS — G894 Chronic pain syndrome: Secondary | ICD-10-CM | POA: Diagnosis not present

## 2023-08-07 DIAGNOSIS — I129 Hypertensive chronic kidney disease with stage 1 through stage 4 chronic kidney disease, or unspecified chronic kidney disease: Secondary | ICD-10-CM | POA: Diagnosis not present

## 2023-08-07 DIAGNOSIS — E1129 Type 2 diabetes mellitus with other diabetic kidney complication: Secondary | ICD-10-CM | POA: Diagnosis not present

## 2023-08-07 DIAGNOSIS — H01006 Unspecified blepharitis left eye, unspecified eyelid: Secondary | ICD-10-CM | POA: Diagnosis not present

## 2023-08-07 DIAGNOSIS — N1832 Chronic kidney disease, stage 3b: Secondary | ICD-10-CM | POA: Diagnosis not present

## 2023-08-07 DIAGNOSIS — I10 Essential (primary) hypertension: Secondary | ICD-10-CM | POA: Diagnosis not present

## 2023-08-07 DIAGNOSIS — E1140 Type 2 diabetes mellitus with diabetic neuropathy, unspecified: Secondary | ICD-10-CM | POA: Diagnosis not present

## 2023-08-07 DIAGNOSIS — N1831 Chronic kidney disease, stage 3a: Secondary | ICD-10-CM | POA: Diagnosis not present

## 2023-08-07 DIAGNOSIS — I712 Thoracic aortic aneurysm, without rupture, unspecified: Secondary | ICD-10-CM | POA: Diagnosis not present

## 2023-09-27 DIAGNOSIS — I10 Essential (primary) hypertension: Secondary | ICD-10-CM | POA: Diagnosis not present

## 2023-09-27 DIAGNOSIS — E1140 Type 2 diabetes mellitus with diabetic neuropathy, unspecified: Secondary | ICD-10-CM | POA: Diagnosis not present

## 2023-09-27 DIAGNOSIS — G894 Chronic pain syndrome: Secondary | ICD-10-CM | POA: Diagnosis not present

## 2023-09-27 DIAGNOSIS — I712 Thoracic aortic aneurysm, without rupture, unspecified: Secondary | ICD-10-CM | POA: Diagnosis not present

## 2023-09-27 DIAGNOSIS — I719 Aortic aneurysm of unspecified site, without rupture: Secondary | ICD-10-CM | POA: Diagnosis not present

## 2023-09-27 DIAGNOSIS — N1831 Chronic kidney disease, stage 3a: Secondary | ICD-10-CM | POA: Diagnosis not present

## 2023-09-27 DIAGNOSIS — E663 Overweight: Secondary | ICD-10-CM | POA: Diagnosis not present

## 2023-09-27 DIAGNOSIS — I129 Hypertensive chronic kidney disease with stage 1 through stage 4 chronic kidney disease, or unspecified chronic kidney disease: Secondary | ICD-10-CM | POA: Diagnosis not present

## 2023-09-27 DIAGNOSIS — E1165 Type 2 diabetes mellitus with hyperglycemia: Secondary | ICD-10-CM | POA: Diagnosis not present

## 2023-10-24 ENCOUNTER — Encounter: Payer: Self-pay | Admitting: Cardiology

## 2023-10-24 ENCOUNTER — Ambulatory Visit: Payer: Medicare HMO | Attending: Cardiology | Admitting: Cardiology

## 2023-10-24 VITALS — BP 136/80 | HR 52 | Ht 68.0 in | Wt 170.0 lb

## 2023-10-24 DIAGNOSIS — I719 Aortic aneurysm of unspecified site, without rupture: Secondary | ICD-10-CM

## 2023-10-24 DIAGNOSIS — I493 Ventricular premature depolarization: Secondary | ICD-10-CM | POA: Diagnosis not present

## 2023-10-24 DIAGNOSIS — I10 Essential (primary) hypertension: Secondary | ICD-10-CM

## 2023-10-24 NOTE — Progress Notes (Signed)
Clinical Summary Trevor Ayala is a 72 y.o.male seen today for follow up of the following medical problems.      1. Irregular heart beat/PVCs - completed holter monitor which showed fairly frequent PVCs (approx 12000), 2 short runs of NSVT. - echo and stress competed without significant abnormalities.     Jan 2023 echo: LVEF 65-70%, no WMAs, mild MR   - EKG today shows NSR - no recent symptmos.    2. HTN - compliant with meds   3. Dizziness - seen 11/2021 by PA Strader for dizziness - symptoms worst with head position, orthostatics were negative.  - reports was diagnosed with vertigo   - no recent symptoms.      4. CKD 3 - followed by [facility]   5. Ascending aortic aneurysm - noted by Jan 2023 echo, ascending aorta 4.6 cm. I reviewed study and confirmed measurement.    06/2022 MRA chest: 5.2 cm ascending aorta.  11/2022 MRA: stable 5.2 cm ascending aortic aneurysm     SH: retired 5 years ago. Has custody of 88 year old granddaughter. 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   Wife with recent fall, facial injury. 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 2015: echo showing EF of 60-65%, no WMA, Grade 1 DD, and mild MR. NST low-risk.    Renal calculus, left    Right ureteral stone    Wears glasses      No Known Allergies   Current Outpatient Medications  Medication Sig Dispense Refill   amLODipine (NORVASC) 10 MG tablet Take 10 mg by mouth in the morning.     lisinopril (ZESTRIL) 10 MG tablet Take 10 mg by mouth at bedtime.     metoprolol tartrate (LOPRESSOR) 25 MG tablet Take 0.5 tablets (12.5 mg total) by  mouth 2 (two) times daily. 90 tablet 3   oxyCODONE-acetaminophen (PERCOCET) 10-325 MG tablet Take 1 tablet by mouth every 6 (six) hours as needed (sciatica pain).     zolpidem (AMBIEN) 10 MG tablet Take 10 mg by mouth at bedtime as needed for sleep.     No current facility-administered medications for this visit.     Past Surgical History:  Procedure Laterality Date   COLONOSCOPY WITH PROPOFOL N/A 04/10/2021   Procedure: COLONOSCOPY WITH PROPOFOL;  Surgeon: Lanelle Bal, DO;  [facility];  Service: Endoscopy;  Laterality: N/A;  PM   CYSTOSCOPY W/ RETROGRADES Left 10/16/2017   Procedure: CYSTOSCOPY WITH URETEROSCOPY AND  RETROGRADE PYELOGRAM;  Surgeon: Sebastian Ache, MD;  Location: [facility];  Service: Urology;  Laterality: Left;   CYSTOSCOPY WITH RETROGRADE PYELOGRAM, URETEROSCOPY AND STENT PLACEMENT Right 10/06/2014   Procedure: CYSTOSCOPY WITH RETROGRADE PYELOGRAM, URETEROSCOPY AND STENT PLACEMENT;  Surgeon: Sebastian Ache, MD;  Location: [facility];  Service: Urology;  Laterality: Right;   HOLMIUM LASER APPLICATION Right 10/06/2014   Procedure: HOLMIUM LASER APPLICATION;  Surgeon: Sebastian Ache, MD;  Location: [facility];  Service: Urology;  Laterality: Right;   HOLMIUM LASER APPLICATION Left 10/16/2017  Procedure: HOLMIUM LASER APPLICATION;  Surgeon: Sebastian Ache, MD;  Location: [facility];  Service: Urology;  Laterality: Left;   NEPHROLITHOTOMY Left 10/16/2017   Procedure: NEPHROLITHOTOMY PERCUTANEOUS WITH SURGEON ACCESS;  Surgeon: Sebastian Ache, MD;  Location: [facility];  Service: Urology;  Laterality: Left;   POLYPECTOMY  04/10/2021   Procedure: POLYPECTOMY;  Surgeon: Lanelle Bal, DO;  [facility];  Service: Endoscopy;;   TRANSTHORACIC ECHOCARDIOGRAM  09-09-2014   mild LVH/  ef 60-65%/  grade I diastolic dysfunction/  mild MR/  mild sclerotic Av     No Known Allergies    Family History  Problem Relation Age of Onset    Cancer Mother    Cancer Brother    Colon cancer Neg Hx      Social History Mr. Flowers reports that he has never smoked. He has never used smokeless tobacco. Mr. Mcculla reports no history of alcohol use.   Review of Systems CONSTITUTIONAL: No weight loss, fever, chills, weakness or fatigue.  HEENT: Eyes: No visual loss, blurred vision, double vision or yellow sclerae.No hearing loss, sneezing, congestion, runny nose or sore throat.  SKIN: No rash or itching.  CARDIOVASCULAR: per hpi RESPIRATORY: No shortness of breath, cough or sputum.  GASTROINTESTINAL: No anorexia, nausea, vomiting or diarrhea. No abdominal pain or blood.  GENITOURINARY: No burning on urination, no polyuria NEUROLOGICAL: No headache, dizziness, syncope, paralysis, ataxia, numbness or tingling in the extremities. No change in bowel or bladder control.  MUSCULOSKELETAL: No muscle, back pain, joint pain or stiffness.  LYMPHATICS: No enlarged nodes. No history of splenectomy.  PSYCHIATRIC: No history of depression or anxiety.  ENDOCRINOLOGIC: No reports of sweating, cold or heat intolerance. No polyuria or polydipsia.  Marland Kitchen   Physical Examination Today's Vitals   10/24/23 0945 10/24/23 0948  BP: (!) 142/90 136/80  Pulse: (!) 52   SpO2: 100%   Weight: 170 lb (77.1 kg)   Height: 5\' 8"  (1.727 m)    Body mass index is 25.85 kg/m.  Gen: resting comfortably, no acute distress HEENT: no scleral icterus, pupils equal round and reactive, no palptable cervical adenopathy,  CV: RRR, no m/rg, no jvd Resp: Clear to auscultation bilaterally GI: abdomen is soft, non-tender, non-distended, normal bowel sounds, no hepatosplenomegaly MSK: extremities are warm, no edema.  Skin: warm, no rash Neuro:  no focal deficits Psych: appropriate affect   Diagnostic Studies 09/2014 Echo Study Conclusions  - Left ventricle: The cavity size was normal. Wall thickness was   increased in a pattern of mild LVH. Systolic function  was normal.   The estimated ejection fraction was in the range of 60% to 65%.   Wall motion was normal; there were no regional wall motion   abnormalities. Doppler parameters are consistent with abnormal   left ventricular relaxation (grade 1 diastolic dysfunction).   Doppler parameters are consistent with elevated ventricular   end-diastolic filling pressure. - Aortic valve: Mildly calcified annulus. Trileaflet; mildly   calcified leaflets. - Mitral valve: Calcified annulus. There was mild regurgitation. - Right atrium: Central venous pressure (est): 3 mm Hg. - Tricuspid valve: There was trivial regurgitation. - Pulmonary arteries: Systolic pressure could not be accurately   estimated. - Pericardium, extracardiac: There was no pericardial effusion.  Impressions:  - Mild LVH with LVEF 60-65%, grade 1 diastolic dysfunction with   increased filling pressures. MAC with mild mitral regurgitation.   Mildly sclerotic aortic valve. Trivial tricuspid regurgitation,   unable to assess PASP.   10/2014 MPI  IMPRESSION: 1. No reversible ischemia or infarction.   2. Normal left ventricular wall motion.   3. Left ventricular ejection fraction 64%   4. Low-risk stress test findings*.   08/2014 Holter Frequent PVCs, 2 runs NSVT 6 and 5 beats    Jan 2023 echo 1. Left ventricular ejection fraction, by estimation, is 65 to 70%. The  left ventricle has normal function. The left ventricle has no regional  wall motion abnormalities. There is moderate concentric left ventricular  hypertrophy. Left ventricular  diastolic parameters were normal.   2. Right ventricular systolic function is normal. The right ventricular  size is normal. There is normal pulmonary artery systolic pressure. The  estimated right ventricular systolic pressure is 13.6 mmHg.   3. The mitral valve is grossly normal. Mild mitral valve regurgitation.   4. The aortic valve is tricuspid. Aortic valve regurgitation is trivial.   Aortic valve sclerosis is present, with no evidence of aortic valve  stenosis. Aortic valve mean gradient measures 5.0 mmHg.   5. Aortic dilatation noted. There is moderate dilatation of the ascending  aorta, measuring 46 mm.   6. The inferior vena cava is normal in size with greater than 50%  respiratory variability, suggesting right atrial pressure of 3 mmHg.      06/2022 MRA chest IMPRESSION: Uncomplicated fusiform aneurysmal dilatation of the ascending thoracic aorta measuring 52 mm in diameter. Recommend semi-annual imaging followup by CTA or MRA and referral to cardiothoracic surgery if not already obtained. This recommendation follows 2010 ACCF/AHA/AATS/ACR/ASA/SCA/SCAI/SIR/STS/SVM Guidelines for the Diagnosis and Management of Patients With Thoracic Aortic Disease. Circulation. 2010; 121: [id number]. Aortic aneurysm NOS (ICD10-I71.9)    Assessment and Plan   1. Irregular heart beat/PVCs - several year history, asymptomatic - negative workup for underlying heart disease with echo and stress test - no symptoms, continue to monitor - EKG today shows sinus rhythm   2. HTN -essentially at goal, continue curren tmeds   3. Aortic aneurysm - due for repeat MRA, will order. Have done serial MRAs as opposed to CTAs due to renal dysfunction - he is on beta blocker.      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

## 2023-10-24 NOTE — Patient Instructions (Addendum)
Medication Instructions:  Your physician recommends that you continue on your current medications as directed. Please refer to the Current Medication list given to you today.  Labwork: BMET -1-2 weeks before MRA  Testing/Procedures: MR Angio Chest   Follow-Up: Your physician recommends that you schedule a follow-up appointment in: 6 months  Any Other Special Instructions Will Be Listed Below (If Applicable).  If you need a refill on your cardiac medications before your next appointment, please call your pharmacy.

## 2023-10-25 ENCOUNTER — Other Ambulatory Visit (HOSPITAL_COMMUNITY)
Admission: RE | Admit: 2023-10-25 | Discharge: 2023-10-25 | Disposition: A | Discharge status: Home / Self Care | Payer: Medicare HMO | Source: Ambulatory Visit | Attending: Cardiology | Admitting: Cardiology

## 2023-10-25 DIAGNOSIS — I719 Aortic aneurysm of unspecified site, without rupture: Secondary | ICD-10-CM

## 2023-10-25 DIAGNOSIS — I493 Ventricular premature depolarization: Secondary | ICD-10-CM | POA: Diagnosis not present

## 2023-10-25 DIAGNOSIS — I10 Essential (primary) hypertension: Secondary | ICD-10-CM

## 2023-10-25 LAB — BASIC METABOLIC PANEL
Anion gap: 8 (ref 5–15)
BUN: 31 mg/dL — ABNORMAL HIGH (ref 8–23)
CO2: 23 mmol/L (ref 22–32)
Calcium: 9 mg/dL (ref 8.9–10.3)
Chloride: 106 mmol/L (ref 98–111)
Creatinine, Ser: 2.09 mg/dL — ABNORMAL HIGH (ref 0.61–1.24)
GFR, Estimated: 33 mL/min — ABNORMAL LOW (ref >60)
Glucose, Bld: 158 mg/dL — ABNORMAL HIGH (ref 70–99)
Potassium: 4.7 mmol/L (ref 3.5–5.1)
Sodium: 137 mmol/L (ref 135–145)

## 2023-10-28 DIAGNOSIS — E1165 Type 2 diabetes mellitus with hyperglycemia: Secondary | ICD-10-CM | POA: Diagnosis not present

## 2023-10-28 DIAGNOSIS — N1832 Chronic kidney disease, stage 3b: Secondary | ICD-10-CM | POA: Diagnosis not present

## 2023-10-28 DIAGNOSIS — E1129 Type 2 diabetes mellitus with other diabetic kidney complication: Secondary | ICD-10-CM | POA: Diagnosis not present

## 2023-10-28 DIAGNOSIS — I719 Aortic aneurysm of unspecified site, without rupture: Secondary | ICD-10-CM | POA: Diagnosis not present

## 2023-10-28 DIAGNOSIS — E1122 Type 2 diabetes mellitus with diabetic chronic kidney disease: Secondary | ICD-10-CM | POA: Diagnosis not present

## 2023-10-28 DIAGNOSIS — E1140 Type 2 diabetes mellitus with diabetic neuropathy, unspecified: Secondary | ICD-10-CM | POA: Diagnosis not present

## 2023-10-28 DIAGNOSIS — I712 Thoracic aortic aneurysm, without rupture, unspecified: Secondary | ICD-10-CM | POA: Diagnosis not present

## 2023-10-28 DIAGNOSIS — N1831 Chronic kidney disease, stage 3a: Secondary | ICD-10-CM | POA: Diagnosis not present

## 2023-10-28 DIAGNOSIS — I10 Essential (primary) hypertension: Secondary | ICD-10-CM | POA: Diagnosis not present

## 2023-11-01 ENCOUNTER — Ambulatory Visit (HOSPITAL_COMMUNITY)
Admission: RE | Admit: 2023-11-01 | Discharge: 2023-11-01 | Disposition: A | Discharge status: Home / Self Care | Payer: Medicare HMO | Source: Ambulatory Visit | Attending: Cardiology | Admitting: Cardiology

## 2023-11-01 DIAGNOSIS — I771 Stricture of artery: Secondary | ICD-10-CM | POA: Diagnosis not present

## 2023-11-01 DIAGNOSIS — I712 Thoracic aortic aneurysm, without rupture, unspecified: Secondary | ICD-10-CM | POA: Diagnosis not present

## 2023-11-01 DIAGNOSIS — I719 Aortic aneurysm of unspecified site, without rupture: Secondary | ICD-10-CM | POA: Insufficient documentation

## 2023-11-01 DIAGNOSIS — I517 Cardiomegaly: Secondary | ICD-10-CM | POA: Diagnosis not present

## 2023-11-01 DIAGNOSIS — I700 Atherosclerosis of aorta: Secondary | ICD-10-CM | POA: Diagnosis not present

## 2023-11-01 MED ORDER — GADOBUTROL 1 MMOL/ML IV SOLN
7.5000 mL | Freq: Once | INTRAVENOUS | Status: AC | PRN
  Administered 2023-11-01: 7.5 mL via INTRAVENOUS

## 2023-11-08 ENCOUNTER — Other Ambulatory Visit: Payer: Self-pay | Admitting: Cardiology

## 2023-11-11 ENCOUNTER — Telehealth: Payer: Self-pay | Admitting: Cardiology

## 2023-11-11 MED ORDER — METOPROLOL TARTRATE 25 MG PO TABS: 12.5000 mg | ORAL_TABLET | Freq: Two times a day (BID) | ORAL | 3 refills | Status: DC

## 2023-11-11 NOTE — Telephone Encounter (Signed)
Pt came in office and requested a refill for metoprolol tartrate 2.5 mg be sent to [facility] [address] [place]

## 2023-11-11 NOTE — Telephone Encounter (Signed)
Metoprolol 12.5 mg tablet refilled and sent to pharmacy per pt's request.

## 2023-11-25 DIAGNOSIS — I712 Thoracic aortic aneurysm, without rupture, unspecified: Secondary | ICD-10-CM | POA: Diagnosis not present

## 2023-11-25 DIAGNOSIS — M5134 Other intervertebral disc degeneration, thoracic region: Secondary | ICD-10-CM | POA: Diagnosis not present

## 2023-11-25 DIAGNOSIS — I719 Aortic aneurysm of unspecified site, without rupture: Secondary | ICD-10-CM | POA: Diagnosis not present

## 2023-11-25 DIAGNOSIS — E1129 Type 2 diabetes mellitus with other diabetic kidney complication: Secondary | ICD-10-CM | POA: Diagnosis not present

## 2023-11-25 DIAGNOSIS — H01006 Unspecified blepharitis left eye, unspecified eyelid: Secondary | ICD-10-CM | POA: Diagnosis not present

## 2023-11-25 DIAGNOSIS — E1140 Type 2 diabetes mellitus with diabetic neuropathy, unspecified: Secondary | ICD-10-CM | POA: Diagnosis not present

## 2023-11-25 DIAGNOSIS — N1832 Chronic kidney disease, stage 3b: Secondary | ICD-10-CM | POA: Diagnosis not present

## 2023-11-25 DIAGNOSIS — I129 Hypertensive chronic kidney disease with stage 1 through stage 4 chronic kidney disease, or unspecified chronic kidney disease: Secondary | ICD-10-CM | POA: Diagnosis not present

## 2023-11-25 DIAGNOSIS — I10 Essential (primary) hypertension: Secondary | ICD-10-CM | POA: Diagnosis not present

## 2023-12-23 DIAGNOSIS — E1165 Type 2 diabetes mellitus with hyperglycemia: Secondary | ICD-10-CM | POA: Diagnosis not present

## 2023-12-23 DIAGNOSIS — E1140 Type 2 diabetes mellitus with diabetic neuropathy, unspecified: Secondary | ICD-10-CM | POA: Diagnosis not present

## 2023-12-23 DIAGNOSIS — E1122 Type 2 diabetes mellitus with diabetic chronic kidney disease: Secondary | ICD-10-CM | POA: Diagnosis not present

## 2023-12-23 DIAGNOSIS — E1129 Type 2 diabetes mellitus with other diabetic kidney complication: Secondary | ICD-10-CM | POA: Diagnosis not present

## 2023-12-23 DIAGNOSIS — N1832 Chronic kidney disease, stage 3b: Secondary | ICD-10-CM | POA: Diagnosis not present

## 2023-12-23 DIAGNOSIS — I10 Essential (primary) hypertension: Secondary | ICD-10-CM | POA: Diagnosis not present

## 2023-12-23 DIAGNOSIS — I700 Atherosclerosis of aorta: Secondary | ICD-10-CM | POA: Diagnosis not present

## 2023-12-23 DIAGNOSIS — I712 Thoracic aortic aneurysm, without rupture, unspecified: Secondary | ICD-10-CM | POA: Diagnosis not present

## 2023-12-23 DIAGNOSIS — M5134 Other intervertebral disc degeneration, thoracic region: Secondary | ICD-10-CM | POA: Diagnosis not present

## 2024-01-20 DIAGNOSIS — N281 Cyst of kidney, acquired: Secondary | ICD-10-CM | POA: Diagnosis not present

## 2024-01-22 DIAGNOSIS — E1140 Type 2 diabetes mellitus with diabetic neuropathy, unspecified: Secondary | ICD-10-CM | POA: Diagnosis not present

## 2024-01-22 DIAGNOSIS — M5134 Other intervertebral disc degeneration, thoracic region: Secondary | ICD-10-CM | POA: Diagnosis not present

## 2024-01-22 DIAGNOSIS — E1122 Type 2 diabetes mellitus with diabetic chronic kidney disease: Secondary | ICD-10-CM | POA: Diagnosis not present

## 2024-01-22 DIAGNOSIS — N1832 Chronic kidney disease, stage 3b: Secondary | ICD-10-CM | POA: Diagnosis not present

## 2024-01-22 DIAGNOSIS — E663 Overweight: Secondary | ICD-10-CM | POA: Diagnosis not present

## 2024-01-22 DIAGNOSIS — E1165 Type 2 diabetes mellitus with hyperglycemia: Secondary | ICD-10-CM | POA: Diagnosis not present

## 2024-01-22 DIAGNOSIS — I10 Essential (primary) hypertension: Secondary | ICD-10-CM | POA: Diagnosis not present

## 2024-01-22 DIAGNOSIS — E1129 Type 2 diabetes mellitus with other diabetic kidney complication: Secondary | ICD-10-CM | POA: Diagnosis not present

## 2024-01-22 DIAGNOSIS — I700 Atherosclerosis of aorta: Secondary | ICD-10-CM | POA: Diagnosis not present

## 2024-01-27 DIAGNOSIS — N281 Cyst of kidney, acquired: Secondary | ICD-10-CM | POA: Diagnosis not present

## 2024-01-27 DIAGNOSIS — N200 Calculus of kidney: Secondary | ICD-10-CM | POA: Diagnosis not present

## 2024-01-27 DIAGNOSIS — R34 Anuria and oliguria: Secondary | ICD-10-CM | POA: Diagnosis not present

## 2024-01-27 DIAGNOSIS — N189 Chronic kidney disease, unspecified: Secondary | ICD-10-CM | POA: Diagnosis not present

## 2024-01-27 DIAGNOSIS — Z125 Encounter for screening for malignant neoplasm of prostate: Secondary | ICD-10-CM | POA: Diagnosis not present

## 2024-02-05 DIAGNOSIS — Z0100 Encounter for examination of eyes and vision without abnormal findings: Secondary | ICD-10-CM | POA: Diagnosis not present

## 2024-02-05 DIAGNOSIS — H25013 Cortical age-related cataract, bilateral: Secondary | ICD-10-CM | POA: Diagnosis not present

## 2024-02-19 DIAGNOSIS — G894 Chronic pain syndrome: Secondary | ICD-10-CM | POA: Diagnosis not present

## 2024-02-19 DIAGNOSIS — I712 Thoracic aortic aneurysm, without rupture, unspecified: Secondary | ICD-10-CM | POA: Diagnosis not present

## 2024-02-19 DIAGNOSIS — E1129 Type 2 diabetes mellitus with other diabetic kidney complication: Secondary | ICD-10-CM | POA: Diagnosis not present

## 2024-02-19 DIAGNOSIS — E1165 Type 2 diabetes mellitus with hyperglycemia: Secondary | ICD-10-CM | POA: Diagnosis not present

## 2024-02-19 DIAGNOSIS — E039 Hypothyroidism, unspecified: Secondary | ICD-10-CM | POA: Diagnosis not present

## 2024-02-19 DIAGNOSIS — E1122 Type 2 diabetes mellitus with diabetic chronic kidney disease: Secondary | ICD-10-CM | POA: Diagnosis not present

## 2024-02-19 DIAGNOSIS — I10 Essential (primary) hypertension: Secondary | ICD-10-CM | POA: Diagnosis not present

## 2024-02-19 DIAGNOSIS — E1140 Type 2 diabetes mellitus with diabetic neuropathy, unspecified: Secondary | ICD-10-CM | POA: Diagnosis not present

## 2024-02-19 DIAGNOSIS — M5134 Other intervertebral disc degeneration, thoracic region: Secondary | ICD-10-CM | POA: Diagnosis not present

## 2024-02-19 DIAGNOSIS — D518 Other vitamin B12 deficiency anemias: Secondary | ICD-10-CM | POA: Diagnosis not present

## 2024-02-19 DIAGNOSIS — E559 Vitamin D deficiency, unspecified: Secondary | ICD-10-CM | POA: Diagnosis not present

## 2024-02-19 DIAGNOSIS — N1832 Chronic kidney disease, stage 3b: Secondary | ICD-10-CM | POA: Diagnosis not present

## 2024-02-19 DIAGNOSIS — Z125 Encounter for screening for malignant neoplasm of prostate: Secondary | ICD-10-CM | POA: Diagnosis not present

## 2024-02-19 DIAGNOSIS — Z0001 Encounter for general adult medical examination with abnormal findings: Secondary | ICD-10-CM | POA: Diagnosis not present

## 2024-03-18 DIAGNOSIS — N1832 Chronic kidney disease, stage 3b: Secondary | ICD-10-CM | POA: Diagnosis not present

## 2024-03-18 DIAGNOSIS — I719 Aortic aneurysm of unspecified site, without rupture: Secondary | ICD-10-CM | POA: Diagnosis not present

## 2024-03-18 DIAGNOSIS — I10 Essential (primary) hypertension: Secondary | ICD-10-CM | POA: Diagnosis not present

## 2024-03-18 DIAGNOSIS — I712 Thoracic aortic aneurysm, without rupture, unspecified: Secondary | ICD-10-CM | POA: Diagnosis not present

## 2024-03-18 DIAGNOSIS — N1831 Chronic kidney disease, stage 3a: Secondary | ICD-10-CM | POA: Diagnosis not present

## 2024-03-18 DIAGNOSIS — M5134 Other intervertebral disc degeneration, thoracic region: Secondary | ICD-10-CM | POA: Diagnosis not present

## 2024-03-18 DIAGNOSIS — N184 Chronic kidney disease, stage 4 (severe): Secondary | ICD-10-CM | POA: Diagnosis not present

## 2024-03-18 DIAGNOSIS — E1165 Type 2 diabetes mellitus with hyperglycemia: Secondary | ICD-10-CM | POA: Diagnosis not present

## 2024-03-18 DIAGNOSIS — E1129 Type 2 diabetes mellitus with other diabetic kidney complication: Secondary | ICD-10-CM | POA: Diagnosis not present

## 2024-03-31 ENCOUNTER — Encounter: Payer: Self-pay | Admitting: *Deleted

## 2024-05-14 DIAGNOSIS — E1129 Type 2 diabetes mellitus with other diabetic kidney complication: Secondary | ICD-10-CM | POA: Diagnosis not present

## 2024-05-14 DIAGNOSIS — E1122 Type 2 diabetes mellitus with diabetic chronic kidney disease: Secondary | ICD-10-CM | POA: Diagnosis not present

## 2024-05-14 DIAGNOSIS — I10 Essential (primary) hypertension: Secondary | ICD-10-CM | POA: Diagnosis not present

## 2024-05-14 DIAGNOSIS — E663 Overweight: Secondary | ICD-10-CM | POA: Diagnosis not present

## 2024-05-14 DIAGNOSIS — I719 Aortic aneurysm of unspecified site, without rupture: Secondary | ICD-10-CM | POA: Diagnosis not present

## 2024-05-14 DIAGNOSIS — N1832 Chronic kidney disease, stage 3b: Secondary | ICD-10-CM | POA: Diagnosis not present

## 2024-05-14 DIAGNOSIS — E1140 Type 2 diabetes mellitus with diabetic neuropathy, unspecified: Secondary | ICD-10-CM | POA: Diagnosis not present

## 2024-05-14 DIAGNOSIS — I712 Thoracic aortic aneurysm, without rupture, unspecified: Secondary | ICD-10-CM | POA: Diagnosis not present

## 2024-05-14 DIAGNOSIS — N1831 Chronic kidney disease, stage 3a: Secondary | ICD-10-CM | POA: Diagnosis not present

## 2024-06-11 DIAGNOSIS — E1129 Type 2 diabetes mellitus with other diabetic kidney complication: Secondary | ICD-10-CM | POA: Diagnosis not present

## 2024-06-11 DIAGNOSIS — E1140 Type 2 diabetes mellitus with diabetic neuropathy, unspecified: Secondary | ICD-10-CM | POA: Diagnosis not present

## 2024-06-11 DIAGNOSIS — E1165 Type 2 diabetes mellitus with hyperglycemia: Secondary | ICD-10-CM | POA: Diagnosis not present

## 2024-06-11 DIAGNOSIS — I10 Essential (primary) hypertension: Secondary | ICD-10-CM | POA: Diagnosis not present

## 2024-06-11 DIAGNOSIS — N184 Chronic kidney disease, stage 4 (severe): Secondary | ICD-10-CM | POA: Diagnosis not present

## 2024-06-11 DIAGNOSIS — M5134 Other intervertebral disc degeneration, thoracic region: Secondary | ICD-10-CM | POA: Diagnosis not present

## 2024-06-11 DIAGNOSIS — Z6825 Body mass index (BMI) 25.0-25.9, adult: Secondary | ICD-10-CM | POA: Diagnosis not present

## 2024-07-09 DIAGNOSIS — N1832 Chronic kidney disease, stage 3b: Secondary | ICD-10-CM | POA: Diagnosis not present

## 2024-07-09 DIAGNOSIS — I700 Atherosclerosis of aorta: Secondary | ICD-10-CM | POA: Diagnosis not present

## 2024-07-09 DIAGNOSIS — N1831 Chronic kidney disease, stage 3a: Secondary | ICD-10-CM | POA: Diagnosis not present

## 2024-07-09 DIAGNOSIS — H00019 Hordeolum externum unspecified eye, unspecified eyelid: Secondary | ICD-10-CM | POA: Diagnosis not present

## 2024-07-09 DIAGNOSIS — I10 Essential (primary) hypertension: Secondary | ICD-10-CM | POA: Diagnosis not present

## 2024-07-09 DIAGNOSIS — E1140 Type 2 diabetes mellitus with diabetic neuropathy, unspecified: Secondary | ICD-10-CM | POA: Diagnosis not present

## 2024-07-09 DIAGNOSIS — I719 Aortic aneurysm of unspecified site, without rupture: Secondary | ICD-10-CM | POA: Diagnosis not present

## 2024-07-09 DIAGNOSIS — M5134 Other intervertebral disc degeneration, thoracic region: Secondary | ICD-10-CM | POA: Diagnosis not present

## 2024-07-09 DIAGNOSIS — E1122 Type 2 diabetes mellitus with diabetic chronic kidney disease: Secondary | ICD-10-CM | POA: Diagnosis not present

## 2024-07-16 ENCOUNTER — Ambulatory Visit: Admitting: Student

## 2024-08-07 DIAGNOSIS — E1122 Type 2 diabetes mellitus with diabetic chronic kidney disease: Secondary | ICD-10-CM | POA: Diagnosis not present

## 2024-08-07 DIAGNOSIS — M5134 Other intervertebral disc degeneration, thoracic region: Secondary | ICD-10-CM | POA: Diagnosis not present

## 2024-08-07 DIAGNOSIS — Z6826 Body mass index (BMI) 26.0-26.9, adult: Secondary | ICD-10-CM | POA: Diagnosis not present

## 2024-08-07 DIAGNOSIS — N1831 Chronic kidney disease, stage 3a: Secondary | ICD-10-CM | POA: Diagnosis not present

## 2024-08-07 DIAGNOSIS — E1129 Type 2 diabetes mellitus with other diabetic kidney complication: Secondary | ICD-10-CM | POA: Diagnosis not present

## 2024-08-07 DIAGNOSIS — I10 Essential (primary) hypertension: Secondary | ICD-10-CM | POA: Diagnosis not present

## 2024-09-02 DIAGNOSIS — I7121 Aneurysm of the ascending aorta, without rupture: Secondary | ICD-10-CM | POA: Diagnosis not present

## 2024-09-02 DIAGNOSIS — M549 Dorsalgia, unspecified: Secondary | ICD-10-CM | POA: Diagnosis not present

## 2024-09-02 DIAGNOSIS — I129 Hypertensive chronic kidney disease with stage 1 through stage 4 chronic kidney disease, or unspecified chronic kidney disease: Secondary | ICD-10-CM | POA: Diagnosis not present

## 2024-09-02 DIAGNOSIS — G8929 Other chronic pain: Secondary | ICD-10-CM | POA: Diagnosis not present

## 2024-09-02 DIAGNOSIS — Z87442 Personal history of urinary calculi: Secondary | ICD-10-CM | POA: Diagnosis not present

## 2024-09-02 DIAGNOSIS — F5101 Primary insomnia: Secondary | ICD-10-CM | POA: Diagnosis not present

## 2024-09-02 DIAGNOSIS — Z860100 Personal history of colon polyps, unspecified: Secondary | ICD-10-CM | POA: Diagnosis not present

## 2024-09-02 DIAGNOSIS — I10 Essential (primary) hypertension: Secondary | ICD-10-CM | POA: Diagnosis not present

## 2024-09-02 DIAGNOSIS — N184 Chronic kidney disease, stage 4 (severe): Secondary | ICD-10-CM | POA: Diagnosis not present

## 2024-09-02 NOTE — Progress Notes (Unsigned)
 Cardiology Office Note    Date:  09/03/2024  ID:  AVIK LEONI, DOB [birth year], MRN [id number] Cardiologist: Alvan Carrier, MD Cardiology APP:  Johnson Laymon HERO, PA-C { :  History of Present Illness:    Trevor Ayala is a 73 y.o. male with past medical history of palpitations (PVC's by prior monitor), HTN, Stage 3 CKD, prediabetes and ascending aortic aneurysm (at 4.6 cm by echocardiogram in 12/2021, 5.2 cm by MRA in 06/2022 and 11/2022) who presents to the office today for overdue 46-month follow-up.  He was last examined by Dr. Alvan in 10/2023 and denied any recent chest pain or palpitations at that time. A follow-up MRA of his aortic aneurysm was recommended and this was preferred as compared to a CTA given his renal dysfunction. MRA was performed in 10/2023 and showed stable fusiform aneurysmal dilatation of the ascending thoracic aorta measuring 52 to 53 mm in the greatest oblique short axis dimension and unchanged when compared to prior imaging in 06/2022. Semiannual imaging and referral to CT Surgery were recommended per the radiologist. Dr. Alvan recommended to continue to monitor at that time.  In talking with the patient today, he reports overall feeling well since his last office visit. He remains active in doing chores around his home and denies any recent chest pain or dyspnea on exertion when doing yard work. No recent orthopnea, PND or pitting edema. Says that his blood pressure has been well-controlled when checked at recent office visits. Reports good compliance with his current medical therapy.  Studies Reviewed:   EKG: EKG is not ordered today.  Echocardiogram: 12/2021 IMPRESSIONS     1. Left ventricular ejection fraction, by estimation, is 65 to 70%. The  left ventricle has normal function. The left ventricle has no regional  wall motion abnormalities. There is moderate concentric left ventricular  hypertrophy. Left ventricular  diastolic parameters were  normal.   2. Right ventricular systolic function is normal. The right ventricular  size is normal. There is normal pulmonary artery systolic pressure. The  estimated right ventricular systolic pressure is 13.6 mmHg.   3. The mitral valve is grossly normal. Mild mitral valve regurgitation.   4. The aortic valve is tricuspid. Aortic valve regurgitation is trivial.  Aortic valve sclerosis is present, with no evidence of aortic valve  stenosis. Aortic valve mean gradient measures 5.0 mmHg.   5. Aortic dilatation noted. There is moderate dilatation of the ascending  aorta, measuring 46 mm.   6. The inferior vena cava is normal in size with greater than 50%  respiratory variability, suggesting right atrial pressure of 3 mmHg.   Comparison(s): No prior Echocardiogram.   MRA Chest: 10/2023 IMPRESSION: 1. Stable fusiform aneurysmal dilatation of the ascending thoracic aorta measuring 52 - 53 mm in greatest oblique short axis dimension, unchanged compared to the 06/2022 examination by my direct remeasurement. Ascending recommend semi-annual imaging followup by CTA or MRA and referral to cardiothoracic surgery if not already obtained. This recommendation follows 2010 ACCF/AHA/AATS/ACR/ASA/SCA/SCAI/SIR/STS/SVM Guidelines for the Diagnosis and Management of Patients With Thoracic Aortic Disease. Circulation. 2010; 121: [id number]. Aortic aneurysm NOS (ICD10-I71.9) 2. Cardiomegaly. 3.  Aortic Atherosclerosis (ICD10-I70.0).   Physical Exam:   VS:  BP 120/72   Pulse (!) 56   Ht 5' 8 (1.727 m)   Wt 170 lb 6.4 oz (77.3 kg)   SpO2 98%   BMI 25.91 kg/m    Wt Readings from Last 3 Encounters:  09/03/24 170 lb 6.4 oz (77.3 kg)  10/24/23 170 lb (77.1 kg)  05/20/23 170 lb (77.1 kg)     GEN: Well nourished, well developed male appearing in no acute distress NECK: No JVD; No carotid bruits CARDIAC: RRR, no murmurs, rubs, gallops RESPIRATORY:  Clear to auscultation without rales, wheezing or  rhonchi  ABDOMEN: Appears non-distended. No obvious abdominal masses. EXTREMITIES: No clubbing or cyanosis. No pitting edema.  Distal pedal pulses are 2+ bilaterally.   Assessment and Plan:   1. Aortic aneurysm without rupture, unspecified portion of aorta - His ascending aortic aneurysm measured 5.2 cm by MRI in 06/2022 and was stable at 5.2 to 5.3 cm by repeat imaging in 10/2023. Will arrange for a follow-up MRI for reassessment. Low-threshold to refer to CT Surgery given the size of his aneurysm. - Continue beta-blocker therapy. We reviewed the importance of not lifting heavy objects and avoiding spastic movements.    2. PVC's (premature ventricular contractions) - He was noted to have PVC's by prior monitoring and denies any recent palpitations. Continue current medical therapy with Lopressor  12.5 mg twice daily.  3. Essential hypertension - Blood pressure was initially recorded at 126/88 during today's visit, rechecked and improved to 120/72. He reports this has been well-controlled when checked at other office visits as well. Continue current medical therapy with Amlodipine  10 mg daily, Lisinopril  10 mg daily and Lopressor  12.5 mg twice daily.  4. Stage 3 CKD - Creatinine was at 2.25 when checked in 02/2024 by review of [facility] DXA which is similar to prior values. He is scheduled for follow-up labs in the coming weeks with his PCP. Not currently followed by Nephrology, only Urology.  Signed, Laymon CHRISTELLA Qua, PA-C

## 2024-09-03 ENCOUNTER — Ambulatory Visit: Attending: Student | Admitting: Student

## 2024-09-03 ENCOUNTER — Encounter: Payer: Self-pay | Admitting: Student

## 2024-09-03 VITALS — BP 120/72 | HR 56 | Ht 68.0 in | Wt 170.4 lb

## 2024-09-03 DIAGNOSIS — I719 Aortic aneurysm of unspecified site, without rupture: Secondary | ICD-10-CM

## 2024-09-03 DIAGNOSIS — I493 Ventricular premature depolarization: Secondary | ICD-10-CM | POA: Diagnosis not present

## 2024-09-03 DIAGNOSIS — I10 Essential (primary) hypertension: Secondary | ICD-10-CM | POA: Diagnosis not present

## 2024-09-03 DIAGNOSIS — N1832 Chronic kidney disease, stage 3b: Secondary | ICD-10-CM | POA: Diagnosis not present

## 2024-09-03 NOTE — Patient Instructions (Addendum)
 Medication Instructions:  Your physician recommends that you continue on your current medications as directed. Please refer to the Current Medication list given to you today.  *If you need a refill on your cardiac medications before your next appointment, please call your pharmacy*  Lab Work: NONE   If you have labs (blood work) drawn today and your tests are completely normal, you will receive your results only by: MyChart Message (if you have MyChart) OR A paper copy in the mail If you have any lab test that is abnormal or we need to change your treatment, we will call you to review the results.  Testing/Procedures:  MRA Angio Chest   Follow-Up: At [facility], you and your health needs are our priority.  As part of our continuing mission to provide you with exceptional heart care, our providers are all part of one team.  This team includes your primary Cardiologist (physician) and Advanced Practice Providers or APPs (Physician Assistants and Nurse Practitioners) who all work together to provide you with the care you need, when you need it.  Your next appointment:   1 year(s)  Provider:   You may see Alvan Carrier, MD or one of the following Advanced Practice Providers on your designated Care Team:   Laymon Qua, PA-C  [place], [place] Olivia [place], [place]     We recommend signing up for the patient portal called MyChart.  Sign up information is provided on this After Visit Summary.  MyChart is used to connect with patients for Virtual Visits (Telemedicine).  Patients are able to view lab/test results, encounter notes, upcoming appointments, etc.  Non-urgent messages can be sent to your provider as well.   To learn more about what you can do with MyChart, go to [email or web address].   Other Instructions Thank you for choosing [facility]!

## 2024-10-25 ENCOUNTER — Encounter (HOSPITAL_COMMUNITY): Payer: Self-pay

## 2024-10-26 ENCOUNTER — Ambulatory Visit (HOSPITAL_COMMUNITY)
Admission: RE | Admit: 2024-10-26 | Discharge: 2024-10-26 | Disposition: A | Discharge status: Home / Self Care | Source: Ambulatory Visit | Attending: Student | Admitting: Student

## 2024-10-26 DIAGNOSIS — I719 Aortic aneurysm of unspecified site, without rupture: Secondary | ICD-10-CM | POA: Insufficient documentation

## 2024-10-26 MED ORDER — GADOBUTROL 1 MMOL/ML IV SOLN
8.0000 mL | Freq: Once | INTRAVENOUS | Status: AC | PRN
Start: 2024-10-26 — End: 2024-10-26
  Administered 2024-10-26: 8 mL via INTRAVENOUS

## 2024-10-31 ENCOUNTER — Ambulatory Visit: Payer: Self-pay | Admitting: Student

## 2024-11-03 ENCOUNTER — Ambulatory Visit: Admitting: Cardiology

## 2024-11-12 ENCOUNTER — Other Ambulatory Visit: Payer: Self-pay | Admitting: Cardiology

## 2024-11-17 ENCOUNTER — Telehealth: Payer: Self-pay | Admitting: Cardiology

## 2024-11-17 NOTE — Telephone Encounter (Signed)
 1. Which medications need to be refilled? (please list name of each medication and dose if known) metoprolol   2. Which pharmacy/location (including street and city if local pharmacy) is medication to be sent to? [facility] [address] [place]   3. Do they need a 30 day or 90 day supply? 90

## 2024-11-17 NOTE — Telephone Encounter (Signed)
 Spoke with wife and let her know that I sent Mr. Brodhead 90 day supply of Metoprolol  Tartrate to his preferred pharmacy ([facility] in [place], [place]). She was appreciative and voiced understanding.
